# Patient Record
Sex: Female | Born: 1995 | Race: Black or African American | Hispanic: No | Marital: Single | Smoking: Never smoker
Health system: Southern US, Community
[De-identification: ages and names within clinical notes are randomized; demographics above are authoritative.]

## PROBLEM LIST (undated history)

## (undated) DIAGNOSIS — Z789 Other specified health status: Secondary | ICD-10-CM

---
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
## 2017-08-07 ENCOUNTER — Other Ambulatory Visit: Payer: Self-pay

## 2017-08-07 ENCOUNTER — Emergency Department (HOSPITAL_COMMUNITY)
Admission: EM | Admit: 2017-08-07 | Discharge: 2017-08-07 | Disposition: A | Discharge status: Home / Self Care | Payer: BLUE CROSS/BLUE SHIELD | Attending: Emergency Medicine | Admitting: Emergency Medicine

## 2017-08-07 ENCOUNTER — Emergency Department (HOSPITAL_COMMUNITY): Payer: BLUE CROSS/BLUE SHIELD

## 2017-08-07 ENCOUNTER — Encounter (HOSPITAL_COMMUNITY): Payer: Self-pay | Admitting: Emergency Medicine

## 2017-08-07 DIAGNOSIS — Y999 Unspecified external cause status: Secondary | ICD-10-CM | POA: Diagnosis not present

## 2017-08-07 DIAGNOSIS — X58XXXA Exposure to other specified factors, initial encounter: Secondary | ICD-10-CM | POA: Insufficient documentation

## 2017-08-07 DIAGNOSIS — Y929 Unspecified place or not applicable: Secondary | ICD-10-CM | POA: Insufficient documentation

## 2017-08-07 DIAGNOSIS — S39012A Strain of muscle, fascia and tendon of lower back, initial encounter: Secondary | ICD-10-CM | POA: Diagnosis not present

## 2017-08-07 DIAGNOSIS — Z79899 Other long term (current) drug therapy: Secondary | ICD-10-CM | POA: Diagnosis not present

## 2017-08-07 DIAGNOSIS — Y939 Activity, unspecified: Secondary | ICD-10-CM | POA: Diagnosis not present

## 2017-08-07 DIAGNOSIS — R109 Unspecified abdominal pain: Secondary | ICD-10-CM | POA: Diagnosis present

## 2017-08-07 LAB — URINALYSIS, ROUTINE W REFLEX MICROSCOPIC
Bilirubin Urine: NEGATIVE
Glucose, UA: NEGATIVE mg/dL
Ketones, ur: NEGATIVE mg/dL
Nitrite: NEGATIVE
Protein, ur: NEGATIVE mg/dL
SPECIFIC GRAVITY, URINE: 1.014 (ref 1.005–1.030)
pH: 7 (ref 5.0–8.0)

## 2017-08-07 LAB — PREGNANCY, URINE: PREG TEST UR: NEGATIVE

## 2017-08-07 MED ORDER — NAPROXEN 500 MG PO TABS: 500.0000 mg | ORAL_TABLET | Freq: Two times a day (BID) | ORAL | 0 refills | Status: DC

## 2017-08-07 MED ORDER — CYCLOBENZAPRINE HCL 10 MG PO TABS: 10.0000 mg | ORAL_TABLET | Freq: Two times a day (BID) | ORAL | 0 refills | Status: DC | PRN

## 2017-08-07 MED ORDER — KETOROLAC TROMETHAMINE 15 MG/ML IJ SOLN
15.0000 mg | Freq: Once | INTRAMUSCULAR | Status: AC
  Administered 2017-08-07: 15 mg via INTRAMUSCULAR
  Filled 2017-08-07: qty 1

## 2017-08-07 NOTE — Discharge Instructions (Signed)
Please read attached information regarding your condition and heat therapy. Take naproxen and Flexeril scheduled for the next 2-3 days.  Apply heating pad to affected area and stretch as tolerated. We will contact you with the results of your urine culture when it is available. Follow-up with your primary care provider for further evaluation. Return to ED for worsening symptoms, increasing or severe back pain, injury or falls, numbness in legs, trouble walking, loss of bladder function.

## 2017-08-07 NOTE — ED Notes (Addendum)
Pt is alert and orinted x 4 and is verbally responsive. Pt reports that she was treated for a UTI about a week gao. Pt reports bilateral flank pain that started 2 days ago. Pt denies pain and burning with urination, Pt does report having dark or cloudy urine. Pt mother is at bedside,

## 2017-08-07 NOTE — ED Provider Notes (Signed)
[facility]-EMERGENCY DEPT Provider Note   CSN: [id number] Arrival date &[id number] time: 08/07/17  1631     History   Chief Complaint Chief Complaint  Patient presents with  . Flank Pain    HPI Amanda Wilkinson is a 22 y.o. female who presents to ED for evaluation of one-week history for bilateral flank pain with associated dark and foul-smelling urine.  She was recently treated for UTI about 2 weeks ago and completed her course of antibiotics.  She does report improvement in her dysuria and pain with urination stated that now she is having the back pain.  Describes the pain as sharp, worse with movement and better with rest.  Took a few doses of Tylenol with mild improvement in her symptoms.  Also reports one episode of vomiting last week when the pain began.  Denies any vaginal discharge, abdominal pain, history of kidney stones, hematuria, diarrhea or constipation.  HPI  History reviewed. No pertinent past medical history.  There are no active problems to display for this patient.   History reviewed. No pertinent surgical history.  OB History    No data available       Home Medications    Prior to Admission medications   Medication Sig Start Date End Date Taking? Authorizing Provider  acetaminophen (TYLENOL) 500 MG tablet Take 500 mg by mouth every 6 (six) hours as needed for mild pain or moderate pain.   Yes [provider]  norethindrone-ethinyl estradiol (JUNEL FE,GILDESS FE,LOESTRIN FE) 1-20 MG-MCG tablet Take 1 tablet by mouth daily.   Yes [provider]  cyclobenzaprine (FLEXERIL) 10 MG tablet Take 1 tablet (10 mg total) by mouth 2 (two) times daily as needed for muscle spasms. 08/07/17   Amberlyn Martinezgarcia, PA-C  naproxen (NAPROSYN) 500 MG tablet Take 1 tablet (500 mg total) by mouth 2 (two) times daily. 08/07/17   Tessica Cupo, PA-C  sulfamethoxazole-trimethoprim (BACTRIM DS,SEPTRA DS) 800-160 MG tablet Take 1 tablet by mouth 2 (two) times  daily. 07/19/17   [provider]    Family History No family history on file.  Social History Social History   Tobacco Use  . Smoking status: Never Smoker  Substance Use Topics  . Alcohol use: No  . Drug use: Not on file     Allergies   Patient has no known allergies.   Review of Systems Review of Systems  Constitutional: Negative for appetite change, chills and fever.  HENT: Negative for ear pain, rhinorrhea, sneezing and sore throat.   Eyes: Negative for photophobia and visual disturbance.  Respiratory: Negative for cough, chest tightness, shortness of breath and wheezing.   Cardiovascular: Negative for chest pain and palpitations.  Gastrointestinal: Positive for vomiting. Negative for abdominal pain, blood in stool, constipation, diarrhea and nausea.  Genitourinary: Positive for flank pain. Negative for dysuria, hematuria and urgency.       +foul smelling, dark urine  Musculoskeletal: Negative for myalgias.  Skin: Negative for rash.  Neurological: Negative for dizziness, weakness and light-headedness.     Physical Exam Updated Vital Signs BP (!) 152/99 (BP Location: Right Arm)   Pulse (!) 102   Temp 98 F (36.7 C) (Oral)   Resp 16   Ht 5\' 5"  (1.651 m)   Wt 73.5 kg (162 lb)   LMP 08/07/2017   SpO2 100%   BMI 26.96 kg/m   Physical Exam  Constitutional: She appears well-developed and well-nourished. No distress.  Nontoxic appearing and in no acute distress.  Does appear in slightly uncomfortable.  HENT:  Head: Normocephalic and atraumatic.  Nose: Nose normal.  Eyes: Conjunctivae and EOM are normal. Right eye exhibits no discharge. Left eye exhibits no discharge. No scleral icterus.  Neck: Normal range of motion. Neck supple.  Cardiovascular: Normal rate, regular rhythm, normal heart sounds and intact distal pulses. Exam reveals no gallop and no friction rub.  No murmur heard. Pulmonary/Chest: Effort normal and breath sounds normal. No respiratory  distress.  Abdominal: Soft. Bowel sounds are normal. She exhibits no distension. There is no tenderness. There is no guarding.    Bilateral CVA tenderness.  Musculoskeletal: Normal range of motion. She exhibits no edema.       Arms: No midline spinal tenderness present in lumbar, thoracic or cervical spine. No step-off palpated. No visible bruising, edema or temperature change noted. No objective signs of numbness present. No saddle anesthesia. 2+ DP pulses bilaterally. Sensation intact to light touch. Strength 5/5 in bilateral lower extremities.  Neurological: She is alert. She exhibits normal muscle tone. Coordination normal.  Skin: Skin is warm and dry. No rash noted.  Psychiatric: She has a normal mood and affect.  Nursing note and vitals reviewed.    ED Treatments / Results  Labs (all labs ordered are listed, but only abnormal results are displayed) Labs Reviewed  URINALYSIS, ROUTINE W REFLEX MICROSCOPIC - Abnormal; Notable for the following components:      Result Value   APPearance HAZY (*)    Hgb urine dipstick LARGE (*)    Leukocytes, UA LARGE (*)    Bacteria, UA MANY (*)    Squamous Epithelial / LPF 6-30 (*)    All other components within normal limits  URINE CULTURE  PREGNANCY, URINE    EKG  EKG Interpretation None       Radiology Ct Renal Stone Study  Result Date: 08/07/2017 CLINICAL DATA:  22 year old who was treated for urinary tract infection approximately 1 week ago, presenting with acute onset of bilateral flank pain 2 days ago. EXAM: CT ABDOMEN AND PELVIS WITHOUT CONTRAST TECHNIQUE: Multidetector CT imaging of the abdomen and pelvis was performed following the standard protocol without IV contrast. COMPARISON:  None. FINDINGS: Lower chest: Heart size normal.  Visualized lung bases clear. Hepatobiliary: Normal unenhanced appearance of the liver. Gallbladder contracted and unremarkable (food is present within the stomach). No calcified gallstones. No biliary  ductal dilation. Pancreas: Normal unenhanced appearance. Spleen: Normal unenhanced appearance. Adrenals/Urinary Tract: Normal appearing adrenal glands. No evidence of urinary tract calculi or obstruction on either side. Within the limits of the unenhanced technique, no focal parenchymal abnormality involving either kidney. Urinary bladder decompressed. Stomach/Bowel: Food within the normal appearing stomach. Normal-appearing small bowel. Expected colonic stool burden. Transverse colon, descending colon, sigmoid colon and rectum decompressed. No focal colonic abnormality identified. Normal appearing gas-filled appendix in the right mid and lower pelvis. Vascular/Lymphatic: No visible atherosclerosis. No pathologic lymphadenopathy. Reproductive: Retroverted uterus with a normal unenhanced appearance. Normal unenhanced appearance of the ovaries. No adnexal masses. Other: None. Musculoskeletal: Regional skeleton intact without acute or significant osseous abnormality. IMPRESSION: Normal unenhanced CT of the abdomen and pelvis. Specifically, no evidence of urinary tract calculi. Electronically Signed   By: Hulan Saas M.D.   On: 08/07/2017 20:25    Procedures Procedures (including critical care time)  Medications Ordered in ED Medications  ketorolac (TORADOL) 15 MG/ML injection 15 mg (not administered)     Initial Impression / Assessment and Plan / ED Course  I have reviewed  the triage vital signs and the nursing notes.  Pertinent labs & imaging results that were available during my care of the patient were reviewed by me and considered in my medical decision making (see chart for details).     Patient, with no significant past medical history, who presents to ED for evaluation of one-week history of bilateral flank pain with associated dark and foul-smelling urine.  She was recently treated for UTI about 2 weeks ago and completed her course of antibiotics.  She reports improvement in her dysuria  and urinary discomfort but now she is having the back pain.  Cannot recall any inciting event.  Denies any vaginal discharge, abdominal pain, history of kidney stones, hematuria, diarrhea or constipation.  On physical exam well-appearing but does have some tenderness to palpation of bilateral flanks.  She has no deficits on her neurological exam that would concern me for a spinal cord injury or other infectious cause of her back pain.  She is afebrile with no history of fever.  She denies any vaginal symptoms and declines a pelvic exam at this time.  She has no abdominal tenderness to palpation.  Urinalysis with WBCs, leukocytosis but was also contaminated sample.  Due to her hematuria, flank pain CT was obtained to rule out nephrolithiasis.  This returned as negative for acute abnormality.  I suspect that this could either be an ongoing UTI or just a muscle strain in the area.  I reassured her of her negative workup today but will send her urine for culture in case she needs to be on additional antibiotic therapy.  I did give her supportive measures with anti-inflammatories, muscle relaxer and heat therapy and advised her to follow-up with orthopedist for further evaluation if symptoms persist.  Patient appears stable for discharge at this time.  Strict return precautions given.  Final Clinical Impressions(s) / ED Diagnoses   Final diagnoses:  Strain of lumbar region, initial encounter    ED Discharge Orders        Ordered    naproxen (NAPROSYN) 500 MG tablet  2 times daily     08/07/17 2035    cyclobenzaprine (FLEXERIL) 10 MG tablet  2 times daily PRN     08/07/17 2035     Portions of this note were generated with Dragon dictation software. Dictation errors may occur despite best attempts at proofreading.    Dietrich Pates, PA-C 08/07/17 2042    Terrilee Files, MD 08/08/17 Rickey Primus

## 2017-08-07 NOTE — ED Triage Notes (Signed)
Patient was seen at clinic in December for UTI and was on antibiotics. Patient started having back pain last week with foul odor in urine. Patient reports that she did start her menstrual cycle today.

## 2017-08-07 NOTE — ED Provider Notes (Signed)
Patient placed in Quick Look pathway, seen and evaluated   Chief Complaint: R flank pain and lower back pain since last week  HPI:   Reports R sided flank pain described as pressure, now radiating across back; started after beginning abx for UTI; no dysuria but does have foul odor to urine; no vaginal discharge or abnormal bleeding, abdominal pain  ROS: R flank pain  Physical Exam:   Gen: No distress  Neuro: Awake and Alert  Skin: Warm  Focused Exam: no TTP of R flank, no R CVA tenderness, no midline spinal tenderness   Initiation of care has begun. The patient has been counseled on the process, plan, and necessity for staying for the completion/evaluation, and the remainder of the medical screening examination   Dietrich PatesKhatri, Pegge Cumberledge, PA-C 08/07/17 1649    Terrilee FilesButler, Michael C, MD 08/08/17 [phone]

## 2017-08-09 LAB — URINE CULTURE

## 2019-06-26 ENCOUNTER — Other Ambulatory Visit: Payer: Self-pay

## 2019-06-26 DIAGNOSIS — Z20822 Contact with and (suspected) exposure to covid-19: Secondary | ICD-10-CM

## 2019-06-30 LAB — NOVEL CORONAVIRUS, NAA: SARS-CoV-2, NAA: DETECTED — AB

## 2019-07-01 ENCOUNTER — Telehealth: Payer: Self-pay

## 2019-07-01 NOTE — Telephone Encounter (Signed)
Pt notified of positive COVID-19 test results. Pt verbalized understanding. Pt reports that she had loss of taste and smell but they have returned.Pt advised to remain in self quarantine until at least 10 days since symptom onset And 3 consecutive days fever free without antipyretics And improvement in respiratory symptoms. Patient advised to utilize over the counter medications to treat symptoms. Pt advised to seek treatment in the ED if respiratory issues/distress develops.Pt advised they should only leave home to seek and medical care and must wear a mask in public. Pt instructed to limit contact with family members or caregivers in the home. Pt advised to practice social distancing and to continue to use good preventative care measures such has frequent hand washing, staying out of crowds and cleaning hard surfaces frequently touched in the home.Pt informed that the health department will likely follow up and may have additional recommendations.

## 2019-08-06 ENCOUNTER — Ambulatory Visit: Payer: Medicaid Other | Attending: Internal Medicine

## 2019-08-06 DIAGNOSIS — Z20822 Contact with and (suspected) exposure to covid-19: Secondary | ICD-10-CM

## 2019-08-07 LAB — NOVEL CORONAVIRUS, NAA: SARS-CoV-2, NAA: NOT DETECTED

## 2019-08-10 IMAGING — CT CT RENAL STONE PROTOCOL
2 of 4 series · 16 of 46 positions shown, 18 images · non-contrast
Comparison: None.

CLINICAL DATA: 21-year-old who was treated for urinary tract
infection approximately 1 week ago, presenting with acute onset of
bilateral flank pain 2 days ago.

EXAM:
CT ABDOMEN AND PELVIS WITHOUT CONTRAST
TECHNIQUE: Multidetector CT imaging of the abdomen and pelvis was performed
following the standard protocol without IV contrast.

[Series 2: axial st · axial · 0.63mm/px · z∈[-318,+67]mm · 13 of 89 slices shown, 15 images]
[im 6/89  soft-tissue]
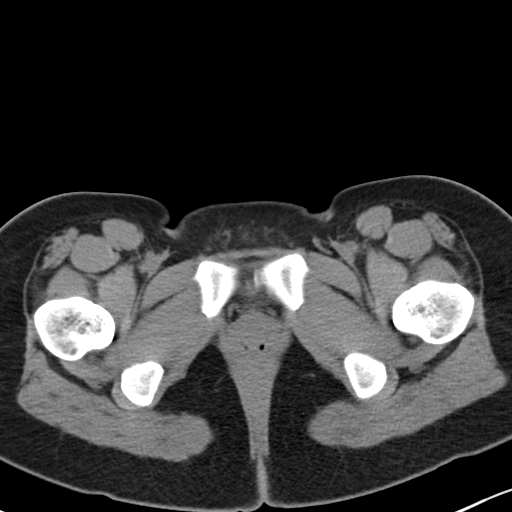
[im 6/89  bone]
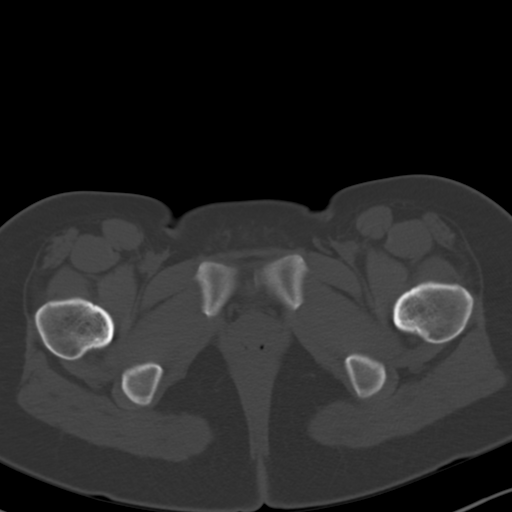
[im 11/89  soft-tissue]
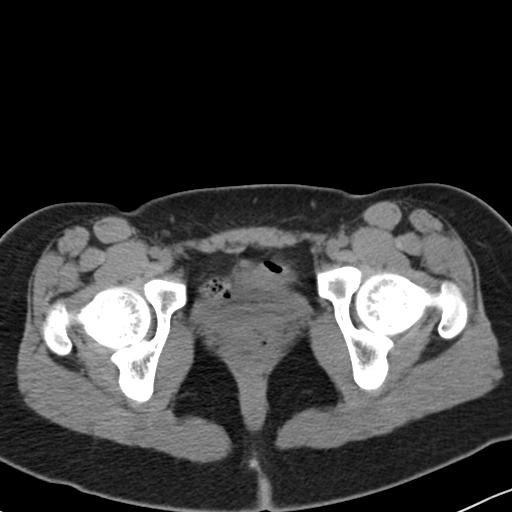
[im 21/89  soft-tissue]
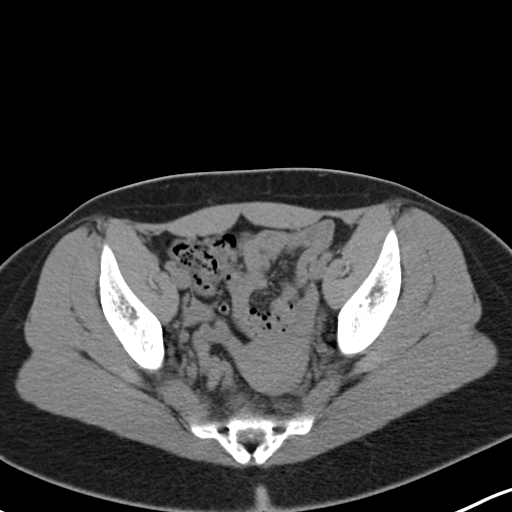
[im 26/89  soft-tissue]
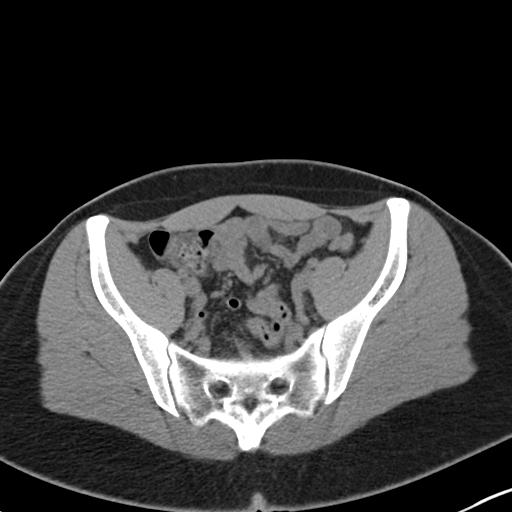
[im 32/89  soft-tissue]
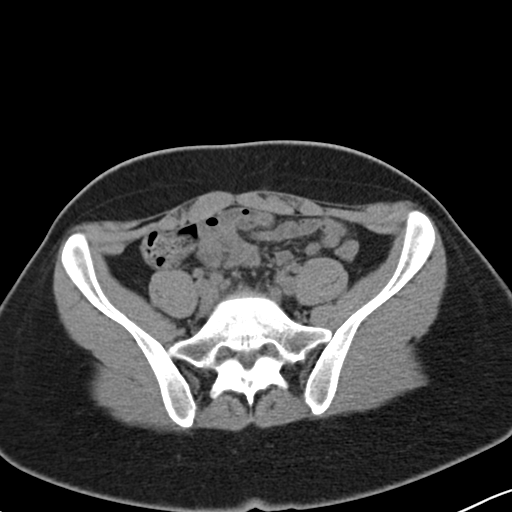
[im 37/89  soft-tissue]
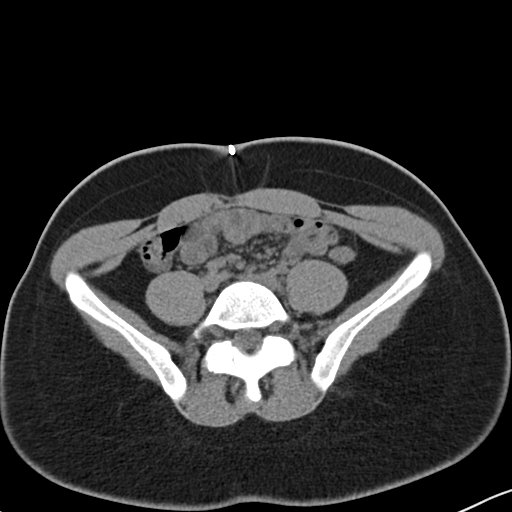
[im 47/89  soft-tissue]
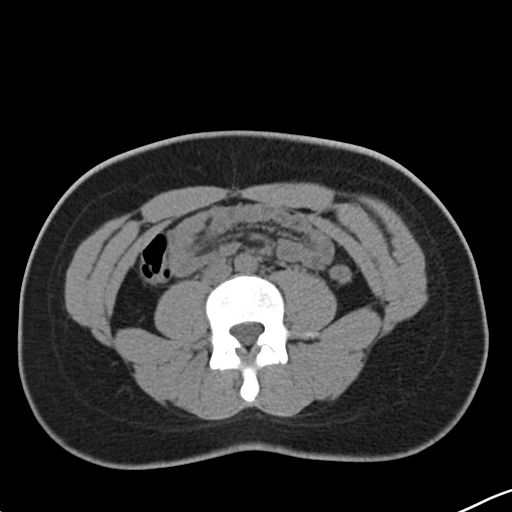
[im 52/89  soft-tissue]
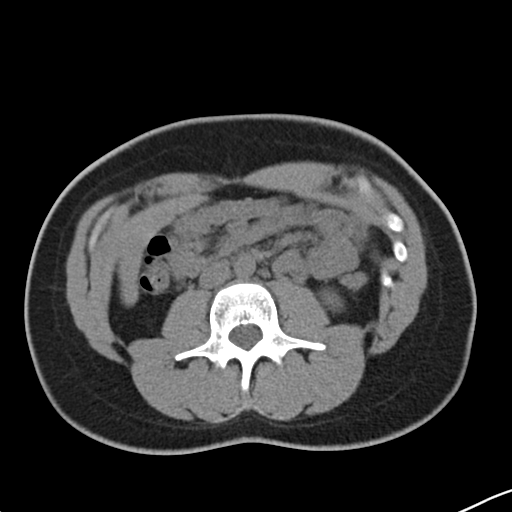
[im 57/89  soft-tissue]
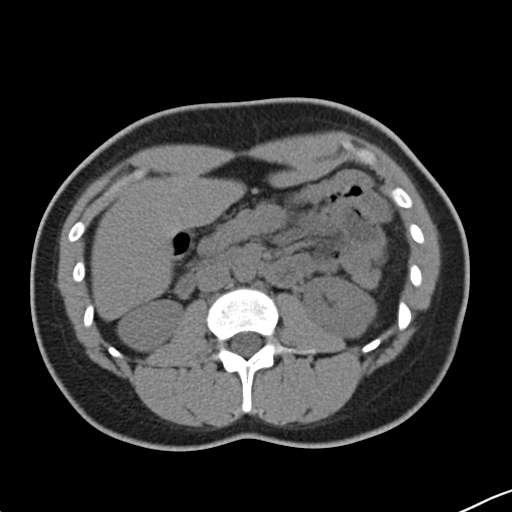
[im 57/89  bone]
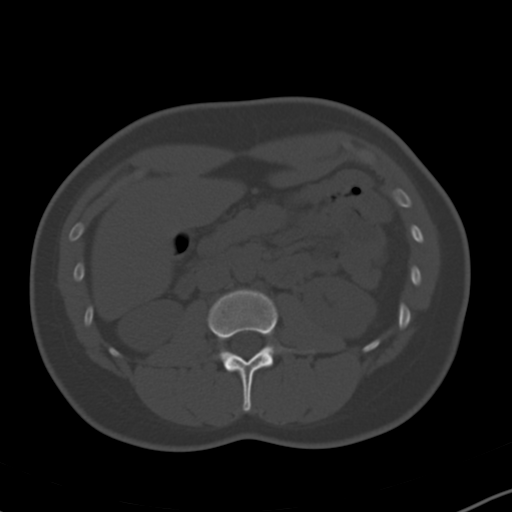
[im 63/89  soft-tissue]
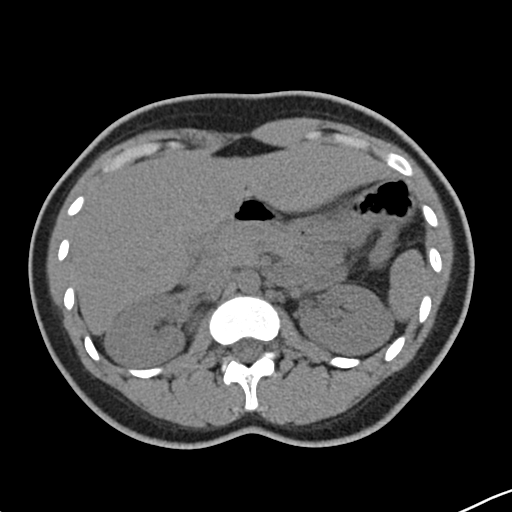
[im 68/89  soft-tissue]
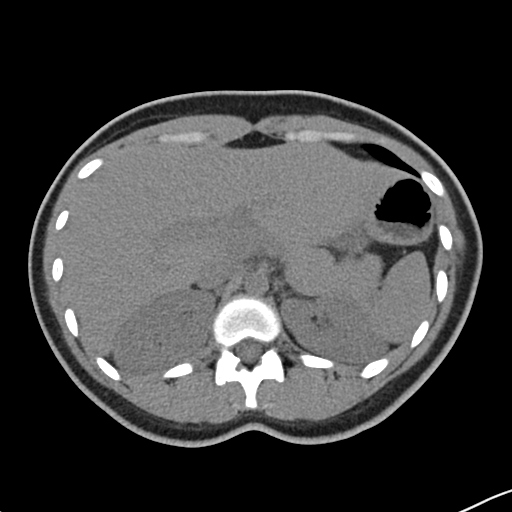
[im 78/89  soft-tissue]
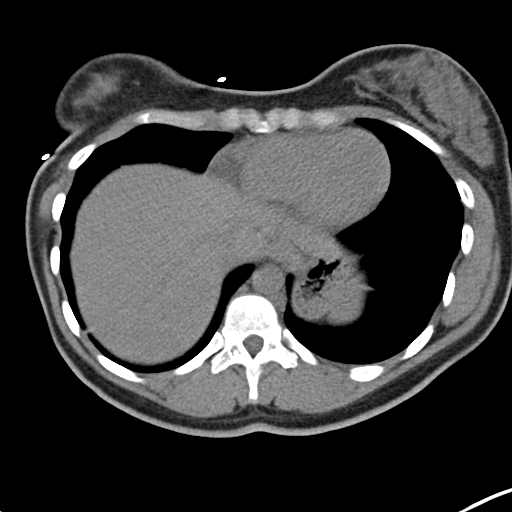
[im 83/89  soft-tissue]
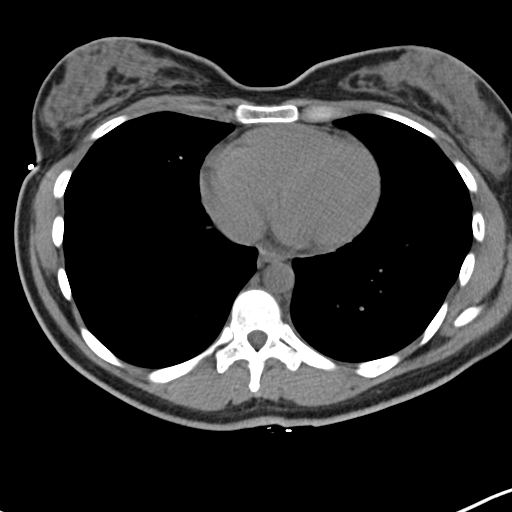

[Series 5: coronal · coronal · 0.74mm/px · 3 of 150 slices shown]
[im 50/150  soft-tissue]
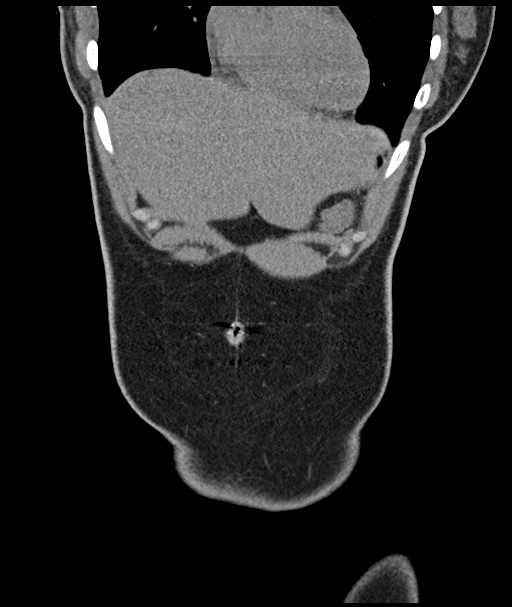
[im 67/150  soft-tissue]
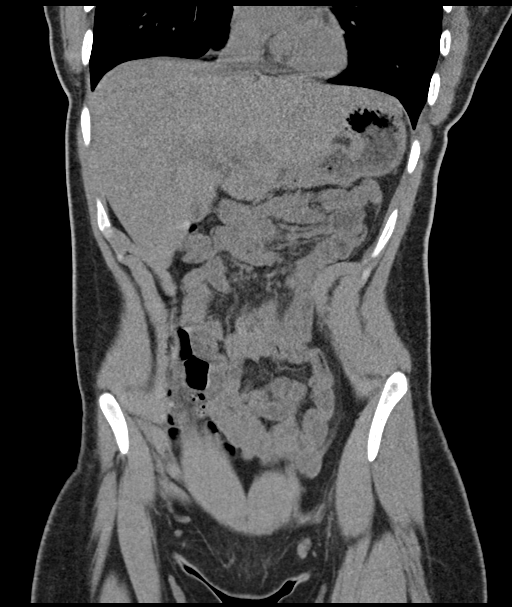
[im 83/150  soft-tissue]
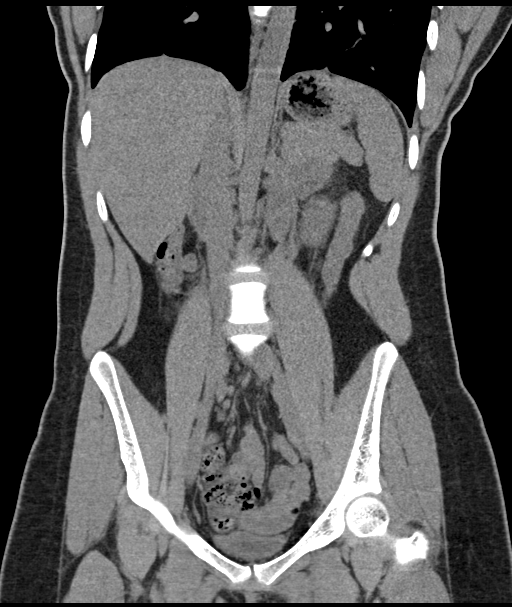

[16 of 46 positions shown; findings below may reference images not displayed]

FINDINGS: Lower chest: Heart size normal.  Visualized lung bases clear.

Hepatobiliary: Normal unenhanced appearance of the liver.
Gallbladder contracted and unremarkable (food is present within the
stomach). No calcified gallstones. No biliary ductal dilation.

Pancreas: Normal unenhanced appearance.

Spleen: Normal unenhanced appearance.

Adrenals/Urinary Tract: Normal appearing adrenal glands. No evidence
of urinary tract calculi or obstruction on either side. Within the
limits of the unenhanced technique, no focal parenchymal abnormality
involving either kidney. Urinary bladder decompressed.

Stomach/Bowel: Food within the normal appearing stomach.
Normal-appearing small bowel. Expected colonic stool burden.
Transverse colon, descending colon, sigmoid colon and rectum
decompressed. No focal colonic abnormality identified. Normal
appearing gas-filled appendix in the right mid and lower pelvis.

Vascular/Lymphatic: No visible atherosclerosis. No pathologic
lymphadenopathy.

Reproductive: Retroverted uterus with a normal unenhanced
appearance. Normal unenhanced appearance of the ovaries. No adnexal
masses.

Other: None.

Musculoskeletal: Regional skeleton intact without acute or
significant osseous abnormality.
IMPRESSION: Normal unenhanced CT of the abdomen and pelvis. Specifically, no
evidence of urinary tract calculi.

## 2019-11-13 ENCOUNTER — Ambulatory Visit: Payer: Medicaid Other | Attending: Internal Medicine

## 2019-11-13 DIAGNOSIS — Z20822 Contact with and (suspected) exposure to covid-19: Secondary | ICD-10-CM

## 2019-11-15 LAB — SARS-COV-2, NAA 2 DAY TAT

## 2019-11-15 LAB — NOVEL CORONAVIRUS, NAA: SARS-CoV-2, NAA: NOT DETECTED

## 2019-11-17 ENCOUNTER — Encounter (HOSPITAL_COMMUNITY): Payer: Self-pay

## 2019-11-17 ENCOUNTER — Other Ambulatory Visit: Payer: Self-pay

## 2019-11-17 ENCOUNTER — Emergency Department (HOSPITAL_COMMUNITY)
Admission: EM | Admit: 2019-11-17 | Discharge: 2019-11-17 | Disposition: A | Discharge status: Home / Self Care | Payer: 59 | Attending: Emergency Medicine | Admitting: Emergency Medicine

## 2019-11-17 DIAGNOSIS — S0990XA Unspecified injury of head, initial encounter: Secondary | ICD-10-CM | POA: Diagnosis not present

## 2019-11-17 DIAGNOSIS — Y999 Unspecified external cause status: Secondary | ICD-10-CM | POA: Diagnosis not present

## 2019-11-17 DIAGNOSIS — Y929 Unspecified place or not applicable: Secondary | ICD-10-CM | POA: Diagnosis not present

## 2019-11-17 DIAGNOSIS — S80212A Abrasion, left knee, initial encounter: Secondary | ICD-10-CM | POA: Diagnosis not present

## 2019-11-17 DIAGNOSIS — Y9302 Activity, running: Secondary | ICD-10-CM | POA: Diagnosis not present

## 2019-11-17 DIAGNOSIS — W01198A Fall on same level from slipping, tripping and stumbling with subsequent striking against other object, initial encounter: Secondary | ICD-10-CM | POA: Insufficient documentation
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
## 2019-11-17 NOTE — Discharge Instructions (Addendum)
Clean the wound of your left knee with soap and water daily and apply ointment such as Neosporin to help it heal.  For the head injury, use Tylenol or Motrin for pain.  Return here, if needed, for problems.
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
## 2020-07-25 NOTE — L&D Delivery Note (Signed)
Delivery Note At 2:15 AM a viable female was delivered via Vaginal, Spontaneous.  Presentation: vertex; Position: Left,, Occiput,, Anterior;  Delivery of the head with tight nuchal reduced.  Anterior shoulder did not deliver with gentle downward traction.  Shoulder dystocia was identified and start time was observed.  Posterior arm was palpated and already flexed with hand at introitus.  Posterior arm was delivered which allowed delivery with gentle downward traction of the anterior shoulder and body. Total time from head delivery to body delivery was 1 minute and 20 seconds per RN recording.  Baby to abdomen with poor tone but good HR.  Code APGAR was called when no response to drying/stimulation.  Cord was clamped and cut and newborn was handed off to NICU team.  Please see NICU note for further neonatal management.    APGAR: per NICU; weight pending.   Placenta status: S, I.   Cord: 3V with the following complications: none.    Anesthesia:  CLEA Episiotomy: None Lacerations: 2nd degree;Periurethral Suture Repair: 3.0 vicryl rapide Est. Blood Loss (mL): 350  Mom to postpartum.  Baby to Couplet care / Skin to Skin.  Mitchel Honour 07/14/2021, 2:43 AM

## 2020-12-01 LAB — OB RESULTS CONSOLE HEPATITIS B SURFACE ANTIGEN: Hepatitis B Surface Ag: NEGATIVE

## 2020-12-01 LAB — OB RESULTS CONSOLE HIV ANTIBODY (ROUTINE TESTING): HIV: NONREACTIVE

## 2020-12-01 LAB — HEPATITIS C ANTIBODY: HCV Ab: NEGATIVE

## 2020-12-01 LAB — OB RESULTS CONSOLE ABO/RH: RH Type: POSITIVE

## 2020-12-01 LAB — OB RESULTS CONSOLE RPR: RPR: NONREACTIVE

## 2020-12-01 LAB — OB RESULTS CONSOLE RUBELLA ANTIBODY, IGM: Rubella: IMMUNE

## 2020-12-01 LAB — OB RESULTS CONSOLE ANTIBODY SCREEN: Antibody Screen: NEGATIVE

## 2020-12-23 ENCOUNTER — Telehealth: Payer: Self-pay

## 2020-12-28 ENCOUNTER — Other Ambulatory Visit: Payer: Self-pay

## 2021-01-12 ENCOUNTER — Ambulatory Visit: Payer: No Typology Code available for payment source

## 2021-01-19 ENCOUNTER — Ambulatory Visit
Payer: No Typology Code available for payment source | Attending: Obstetrics and Gynecology | Admitting: Genetic Counselor

## 2021-01-19 ENCOUNTER — Other Ambulatory Visit: Payer: Self-pay

## 2021-01-19 ENCOUNTER — Encounter: Payer: Self-pay | Admitting: Genetic Counselor

## 2021-01-19 DIAGNOSIS — Z315 Encounter for genetic counseling: Secondary | ICD-10-CM | POA: Diagnosis not present

## 2021-01-19 NOTE — Progress Notes (Signed)
01/19/2021  SHANTEE HAYNE 05/15/1996 MRN: [id number] DOV: 01/19/2021  Ms. 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 Ms. Hinostroza was accompanied to her appointment by her partner, Leonette Most "Luther Parody" Smoaks.   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 Bann is a G76P0, 25 y.o. female. Her current pregnancy has completed [redacted]w[redacted]d (Estimated Date of Delivery: 07/07/21).  Ms. Javid denied exposure to environmental toxins or chemical agents. She denied the use of alcohol, tobacco or street drugs. She reported taking prenatal vitamins and Diclegis. She denied significant viral illnesses, fevers, and bleeding during the course of her pregnancy. Her medical and surgical histories were noncontributory.  Family History  A three generation pedigree was drafted and reviewed. Both family histories were reviewed and found to be noncontributory for birth defects, intellectual disability, recurrent pregnancy loss, and known genetic conditions.    The patient's ancestry is African American. The father of the pregnancy's ancestry is African American. Ashkenazi Jewish ancestry and consanguinity were denied. Pedigree will be scanned under Media.  Discussion  Spinal muscular atrophy:  Ms. 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 Ms. Kerman was found to have 2 copies of the SMN1 gene on Horizon-2 carrier screening; however, she also has the c.*3+80T>G polymorphism of SMN1 in intron 7 (also known as [id number]) found more commonly in silent carriers. This puts her at increased risk (1 in 34, or ~3%) to be a silent carrier for SMA. This  means that there is a 97% chance that she is not a carrier for SMA.  We discussed that SMA is a condition caused by mutations in the SMN1 gene. Typically, individuals have two copies of the SMN1 gene, with one copy present on each chromosome. In SMA silent carriers, both copies of the SMN1 gene are present on one chromosome, with no copies of SMN1 present on the other chromosome (2+0).   SMA is characterized by progressive muscle weakness and atrophy due to degeneration and loss of anterior horn cells (lower motor neurons) in the spinal cord and brain stem. We discussed the different types of SMA, including differences in severity and age of onset. We also reviewed the autosomal recessive inheritance pattern of SMA. We discussed that the couple only has a chance of having a child with SMA if both parents are identified as carriers for the condition. Based on the carrier frequency for SMA in the African American population, Ms. Busker's partner currently has a 1 in 16 chance of being a carrier of SMA. Without partner screening to refine risk and based on her partner's ethnicity, the couple currently has a 1 in 8024 (0.01%) risk of having a child with SMA. If Ms. Leffel's partner were found to have 2 copies of SMN1, his risk of being a carrier would be reduced but not eliminated. However, if both Ms. Goldsmith and her partner are carriers of SMA, they would have a 1 in 4 (25%) chance of having an affected fetus with each pregnancy.   Other carrier screening results:  Ms. Christensen's Horizon-2 carrier screening was negative for the other condition screened (cystic fibrosis). Thus, her risk to be a carrier for this condition has been reduced but  not eliminated. This also significantly reduces her risk of having a child affected by this condition.   We also reviewed that Ms. Weinhold had a hemoglobin electrophoresis to assess her carrier status for hemoglobinopathies such as sickle cell disease. We discussed that  hemoglobin electrophoresis did not reveal that Ms. 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 This does not increase her chance of carrying a hemoglobinopathy or having a child with a hemoglobinopathy.   We discussed that carrier testing for SMA is recommended for Ms. Ficken's partner. Ms. Spickard and her partner indicated that they are interested in pursuing partner carrier screening. I also informed Ms. Silberman that SMA is now included on [place]'s newborn screen.   Aneuploidy screening results:  We also reviewed that Ms. Edinger had Panorama noninvasive prenatal screening (NIPS) through the laboratory [facility] that was low-risk for fetal aneuploidies. We reviewed that these results showed a less than 1 in 10,000 risk for trisomies 21, 18 and 13, and monosomy X (Turner syndrome). In addition, the risk for triploidy and sex chromosome trisomies (47,XXX and 47,XXY) was also low. Ms. Firebaugh elected to have cfDNA analysis for 22q11.2 deletion syndrome, which was also low risk (1 in 3100). We reviewed that while this testing identifies 94-99% of pregnancies with trisomy 72, trisomy 24, and trisomy 7, and >70% of cases of sex chromosome aneuploidies, it is NOT diagnostic. A positive test result requires confirmation by CVS or amniocentesis, and a negative test result does not rule out a fetal chromosome abnormality. She also understands that this testing does not identify all genetic conditions.  Diagnostic testing:  Ms. Folts was also counseled regarding diagnostic testing via amniocentesis from 16 weeks' gestation. We discussed the technical aspects of the procedure and quoted up to a 1 in 500 (0.2%) risk for spontaneous pregnancy loss or other adverse pregnancy outcomes as a result of amniocentesis. Cultured cells from an amniocentesis sample allow for the visualization of a fetal karyotype, which can detect  >99% of large chromosomal aberrations. Chromosomal microarray can also be performed to identify smaller deletions or duplications of fetal chromosomal material. Amniocentesis could also be performed to assess whether the baby is affected by SMA.   Ms. Hafen was not interested in pursuing amniocentesis. She understands that amniocentesis is available at any point after 16 weeks of pregnancy and that she may opt to undergo the procedure at a later date should she change her mind.  Plan:  The couple informed me that they felt reassured by their ~0.01% chance of having a child with SMA. However, Ms. Lunney's partner expressed possible interest in pursuing partner carrier screening for SMA. Mr. Janee Morn requested that I perform a benefits investigation to estimate his out of pocket cost for testing prior to placing an order. I informed him that if the expected cost is >$250 if testing is pursued through his insurance, the laboratory [facility] offers a self-pay option of $250. I will call with results from the benefits investigation and facilitate partner carrier screening if still desired from there.  I counseled Ms. Emmons regarding the above risks and available options. The approximate face-to-face time with the genetic counselor was 30 minutes.  In summary: Discussed carrier screening results and options for follow-up testing Increased risk (1 in 34, or ~3%) chance to be silent carrier for spinal muscular atrophy. 97% chance patient is not a carrier for condition Partner possibly interested in  carrier screening for SMA. I will perform benefits investigation to estimate testing costs and facilitate testing from there if desired Reviewed low-risk NIPS result Reduction in risk for Down syndrome, trisomy 58, trisomy 64, triploidy, sex chromosome aneuploidies, and 22q11.2 deletion syndrome Offered additional testing and screening Not interested in amniocentesis Reviewed family history concerns   Gershon Crane, MS, Aeronautical engineer

## 2021-01-20 ENCOUNTER — Telehealth: Payer: Self-pay | Admitting: Genetic Counselor

## 2021-01-20 NOTE — Telephone Encounter (Signed)
LVM for Amanda Wilkinson informing her that the laboratory [facility] attempted to run a benefits investigation through her partner [name]'s insurance to determine the cost for partner carrier screening; however, his insurance plan appears to be inactive. I encouraged her to have her partner contact her insurance company to check in and contact us once they have more information and have decided how they would like to proceed.  Gershon Crane, MS, [facility] Genetic Counselor

## 2021-02-03 ENCOUNTER — Ambulatory Visit: Payer: Self-pay

## 2021-06-14 LAB — OB RESULTS CONSOLE GBS: GBS: NEGATIVE

## 2021-06-15 LAB — OB RESULTS CONSOLE GC/CHLAMYDIA
Chlamydia: NEGATIVE
Gonorrhea: NEGATIVE

## 2021-07-01 ENCOUNTER — Inpatient Hospital Stay (HOSPITAL_COMMUNITY)
Admission: AD | Admit: 2021-07-01 | Discharge: 2021-07-01 | Disposition: A | Discharge status: Home / Self Care | Payer: No Typology Code available for payment source | Attending: Obstetrics and Gynecology | Admitting: Obstetrics and Gynecology

## 2021-07-01 ENCOUNTER — Other Ambulatory Visit: Payer: Self-pay

## 2021-07-01 ENCOUNTER — Encounter (HOSPITAL_COMMUNITY): Payer: Self-pay | Admitting: Obstetrics and Gynecology

## 2021-07-01 DIAGNOSIS — O471 False labor at or after 37 completed weeks of gestation: Secondary | ICD-10-CM | POA: Insufficient documentation

## 2021-07-01 DIAGNOSIS — R030 Elevated blood-pressure reading, without diagnosis of hypertension: Secondary | ICD-10-CM | POA: Diagnosis present

## 2021-07-01 DIAGNOSIS — O26893 Other specified pregnancy related conditions, third trimester: Secondary | ICD-10-CM | POA: Diagnosis present

## 2021-07-01 DIAGNOSIS — Z3A39 39 weeks gestation of pregnancy: Secondary | ICD-10-CM | POA: Insufficient documentation

## 2021-07-01 LAB — COMPREHENSIVE METABOLIC PANEL
ALT: 17 U/L (ref 0–44)
AST: 21 U/L (ref 15–41)
Albumin: 3 g/dL — ABNORMAL LOW (ref 3.5–5.0)
Alkaline Phosphatase: 148 U/L — ABNORMAL HIGH (ref 38–126)
Anion gap: 5 (ref 5–15)
BUN: <5 mg/dL — ABNORMAL LOW (ref 6–20)
CO2: 24 mmol/L (ref 22–32)
Calcium: 9.5 mg/dL (ref 8.9–10.3)
Chloride: 106 mmol/L (ref 98–111)
Creatinine, Ser: 0.61 mg/dL (ref 0.44–1.00)
GFR, Estimated: >60 mL/min (ref >60)
Glucose, Bld: 80 mg/dL (ref 70–99)
Potassium: 3.9 mmol/L (ref 3.5–5.1)
Sodium: 135 mmol/L (ref 135–145)
Total Bilirubin: 0.4 mg/dL (ref 0.3–1.2)
Total Protein: 6.5 g/dL (ref 6.5–8.1)

## 2021-07-01 LAB — CBC
HCT: 36 % (ref 36.0–46.0)
Hemoglobin: 11.4 g/dL — ABNORMAL LOW (ref 12.0–15.0)
MCH: 27.2 pg (ref 26.0–34.0)
MCHC: 31.7 g/dL (ref 30.0–36.0)
MCV: 85.9 fL (ref 80.0–100.0)
Platelets: 211 10*3/uL (ref 150–400)
RBC: 4.19 MIL/uL (ref 3.87–5.11)
RDW: 15.3 % (ref 11.5–15.5)
WBC: 10.7 10*3/uL — ABNORMAL HIGH (ref 4.0–10.5)
nRBC: 0 % (ref 0.0–0.2)

## 2021-07-01 LAB — PROTEIN / CREATININE RATIO, URINE
Creatinine, Urine: 114.12 mg/dL
Protein Creatinine Ratio: 0.1 mg/mg{Cre} (ref 0.00–0.15)
Total Protein, Urine: 11 mg/dL

## 2021-07-01 LAB — AMNISURE RUPTURE OF MEMBRANE (ROM) NOT AT ARMC: Amnisure ROM: NEGATIVE

## 2021-07-01 LAB — POCT FERN TEST: POCT Fern Test: NEGATIVE

## 2021-07-01 NOTE — MAU Note (Signed)
Amanda Wilkinson is a 25 y.o. at [redacted]w[redacted]d here in MAU reporting: LOF trickling down her legs since about 1545. Called the office and was advised to come in. Fluid is clear and white. No bleeding. No pain. +FM. Last IC was today before the leaking.   Onset of complaint: today  Pain score: 0/10  Vitals:   07/01/21 1711  BP: 139/81  Pulse: 80  Resp: 16  Temp: 99.6 F (37.6 C)  SpO2: 100%     FHT:156  Lab orders placed from triage: none

## 2021-07-01 NOTE — MAU Provider Note (Signed)
Patient Amanda Wilkinson is a 25 y.o. G1P0 At [redacted]w[redacted]d here with complaints LOF around 3:45opm. She denies vaginal bleeding, decreased fetal movements. She endorses some cramping/contractions but does not think she is in labor.  She denies HA, blurry vision, floating spots, RUQ pain.  She denies any history of elevated blood pressure, gestational diabetes or other complications in this pregnancy.  History     CSN: [id number]  Arrival date and time: 07/01/21 1650   Event Date/Time   First Provider Initiated Contact with Patient 07/01/21 1824      Chief Complaint  Patient presents with   Rupture of Membranes   Vaginal Discharge The patient's primary symptoms include vaginal discharge. The patient's pertinent negatives include no vaginal bleeding. The current episode started today. The problem occurs intermittently. Associated symptoms include abdominal pain. Pertinent negatives include no constipation, diarrhea, dysuria or fever. Associated symptoms comments: Abdominal cramps and braxton hicks.   OB History     Gravida  1   Para      Term      Preterm      AB      Living         SAB      IAB      Ectopic      Multiple      Live Births              History reviewed. No pertinent past medical history.  History reviewed. No pertinent surgical history.  History reviewed. No pertinent family history.  Social History   Tobacco Use   Smoking status: Never  Substance Use Topics   Alcohol use: No   Drug use: Never    Allergies: No Known Allergies  Medications Prior to Admission  Medication Sig Dispense Refill Last Dose   ferrous sulfate 325 (65 FE) MG tablet Take 325 mg by mouth daily with breakfast.      Prenatal Vit-Fe Fumarate-FA (PRENATAL MULTIVITAMIN) TABS tablet Take 1 tablet by mouth daily at 12 noon.      acetaminophen (TYLENOL) 500 MG tablet Take 500 mg by mouth every 6 (six) hours as needed for mild pain or moderate pain.   More than a month    cyclobenzaprine (FLEXERIL) 10 MG tablet Take 1 tablet (10 mg total) by mouth 2 (two) times daily as needed for muscle spasms. 20 tablet 0 More than a month   naproxen (NAPROSYN) 500 MG tablet Take 1 tablet (500 mg total) by mouth 2 (two) times daily. 30 tablet 0 More than a month   norethindrone-ethinyl estradiol (JUNEL FE,GILDESS FE,LOESTRIN FE) 1-20 MG-MCG tablet Take 1 tablet by mouth daily.   More than a month   sulfamethoxazole-trimethoprim (BACTRIM DS,SEPTRA DS) 800-160 MG tablet Take 1 tablet by mouth 2 (two) times daily.  0 More than a month    Review of Systems  Constitutional:  Negative for fever.  Gastrointestinal:  Positive for abdominal pain. Negative for constipation and diarrhea.  Genitourinary:  Positive for vaginal discharge. Negative for dysuria.  Physical Exam   Blood pressure 132/82, pulse 78, temperature 99.6 F (37.6 C), temperature source Oral, resp. rate 16, height 5' 5.5" (1.664 m), weight 101.8 kg, SpO2 99 %.  Physical Exam HENT:     Head: Normocephalic.  Cardiovascular:     Pulses: Normal pulses.  Pulmonary:     Effort: Pulmonary effort is normal.  Skin:    General: Skin is warm.  Neurological:     General: No focal deficit  present.     Mental Status: She is alert.    MAU Course  Procedures  MDM -NST : 145 bpm, mod var, present acel, no decels, uterine irratability -CBC: normal -CMP: normal PCR: 0.1 Amnisure negative  Patient Vitals for the past 24 hrs:  BP Temp Temp src Pulse Resp SpO2 Height Weight  07/01/21 2044 139/81 -- -- 87 -- -- -- --  07/01/21 2015 126/69 -- -- 85 -- 99 % -- --  07/01/21 2000 132/82 -- -- 78 -- 99 % -- --  07/01/21 1945 132/84 -- -- 86 -- 99 % -- --  07/01/21 1930 134/81 -- -- 84 -- 100 % -- --  07/01/21 1915 134/80 -- -- 82 -- 100 % -- --  07/01/21 1900 135/78 -- -- 86 -- 99 % -- --  07/01/21 1845 (!) 141/91 -- -- 84 -- 99 % -- --  07/01/21 1838 140/77 -- -- 87 -- -- -- --  07/01/21 1810 134/77 -- -- 86 -- 100 % --  --  07/01/21 1804 (!) 141/81 -- -- 83 -- -- -- --  07/01/21 1711 139/81 99.6 F (37.6 C) Oral 80 16 100 % -- --  07/01/21 1708 -- -- -- -- -- -- 5' 5.5" (1.664 m) 101.8 kg    Discussed with Dr. Crissie Reese, who agrees with plan for discharge and BP check tomorrow. TC to Dr. Vincente Poli, who agrees.  Assessment and Plan   1. Transient hypertension   -patient will call tomorrow at 8:30 for BP check appt.  -patient given strict pre-e precautions; reviewed that at this gestational age if she has elevated BP tomorrow am the recommendation will be delivery  -discussed third trimester precautions , when to return to MAU.  -patient and visitor agree with plan of care.  -All questions answered  Charlesetta Garibaldi [facility] 07/01/2021, 8:07 PM

## 2021-07-09 ENCOUNTER — Other Ambulatory Visit: Payer: Self-pay | Admitting: Obstetrics and Gynecology

## 2021-07-09 LAB — SARS CORONAVIRUS 2 (TAT 6-24 HRS): SARS Coronavirus 2: NEGATIVE

## 2021-07-12 ENCOUNTER — Inpatient Hospital Stay (HOSPITAL_COMMUNITY)
Admission: AD | Admit: 2021-07-12 | Discharge: 2021-07-16 | DRG: 807 | Disposition: A | Discharge status: Home / Self Care | Payer: No Typology Code available for payment source | Attending: Obstetrics & Gynecology | Admitting: Obstetrics & Gynecology

## 2021-07-12 ENCOUNTER — Inpatient Hospital Stay (HOSPITAL_COMMUNITY): Payer: No Typology Code available for payment source

## 2021-07-12 ENCOUNTER — Other Ambulatory Visit: Payer: Self-pay

## 2021-07-12 ENCOUNTER — Encounter (HOSPITAL_COMMUNITY): Payer: Self-pay | Admitting: Obstetrics and Gynecology

## 2021-07-12 DIAGNOSIS — Z3490 Encounter for supervision of normal pregnancy, unspecified, unspecified trimester: Secondary | ICD-10-CM

## 2021-07-12 DIAGNOSIS — Z3A40 40 weeks gestation of pregnancy: Secondary | ICD-10-CM | POA: Diagnosis not present

## 2021-07-12 DIAGNOSIS — O480 Post-term pregnancy: Secondary | ICD-10-CM | POA: Diagnosis present

## 2021-07-12 HISTORY — DX: Other specified health status: Z78.9

## 2021-07-12 LAB — CBC
HCT: 36 % (ref 36.0–46.0)
Hemoglobin: 11.9 g/dL — ABNORMAL LOW (ref 12.0–15.0)
MCH: 28.3 pg (ref 26.0–34.0)
MCHC: 33.1 g/dL (ref 30.0–36.0)
MCV: 85.7 fL (ref 80.0–100.0)
Platelets: 174 10*3/uL (ref 150–400)
RBC: 4.2 MIL/uL (ref 3.87–5.11)
RDW: 15 % (ref 11.5–15.5)
WBC: 9.1 10*3/uL (ref 4.0–10.5)
nRBC: 0 % (ref 0.0–0.2)

## 2021-07-12 LAB — TYPE AND SCREEN
ABO/RH(D): A POS
Antibody Screen: NEGATIVE

## 2021-07-12 MED ORDER — HYDROXYZINE HCL 25 MG PO TABS: 50.0000 mg | ORAL_TABLET | Freq: Four times a day (QID) | ORAL | Status: DC | PRN

## 2021-07-12 MED ORDER — LACTATED RINGERS IV SOLN: INTRAVENOUS | Status: DC

## 2021-07-12 MED ORDER — OXYTOCIN BOLUS FROM INFUSION
333.0000 mL | Freq: Once | INTRAVENOUS | Status: AC
  Administered 2021-07-14: 02:00:00 333 mL via INTRAVENOUS

## 2021-07-12 MED ORDER — OXYCODONE-ACETAMINOPHEN 5-325 MG PO TABS: 2.0000 | ORAL_TABLET | ORAL | Status: DC | PRN

## 2021-07-12 MED ORDER — BUTORPHANOL TARTRATE 1 MG/ML IJ SOLN: 1.0000 mg | INTRAMUSCULAR | Status: DC | PRN

## 2021-07-12 MED ORDER — ONDANSETRON HCL 4 MG/2ML IJ SOLN: 4.0000 mg | Freq: Four times a day (QID) | INTRAMUSCULAR | Status: DC | PRN

## 2021-07-12 MED ORDER — MISOPROSTOL 25 MCG QUARTER TABLET
25.0000 ug | ORAL_TABLET | ORAL | Status: DC | PRN
  Administered 2021-07-12: 20:00:00 25 ug via VAGINAL
  Filled 2021-07-12 (×2): qty 1

## 2021-07-12 MED ORDER — ACETAMINOPHEN 325 MG PO TABS: 650.0000 mg | ORAL_TABLET | ORAL | Status: DC | PRN

## 2021-07-12 MED ORDER — SOD CITRATE-CITRIC ACID 500-334 MG/5ML PO SOLN: 30.0000 mL | ORAL | Status: DC | PRN

## 2021-07-12 MED ORDER — LACTATED RINGERS IV SOLN: 500.0000 mL | INTRAVENOUS | Status: DC | PRN

## 2021-07-12 MED ORDER — LIDOCAINE HCL (PF) 1 % IJ SOLN
30.0000 mL | INTRAMUSCULAR | Status: DC | PRN
  Filled 2021-07-12: qty 30

## 2021-07-12 MED ORDER — OXYCODONE-ACETAMINOPHEN 5-325 MG PO TABS: 1.0000 | ORAL_TABLET | ORAL | Status: DC | PRN

## 2021-07-12 MED ORDER — TERBUTALINE SULFATE 1 MG/ML IJ SOLN
0.2500 mg | Freq: Once | INTRAMUSCULAR | Status: DC | PRN
  Filled 2021-07-12: qty 1

## 2021-07-12 MED ORDER — OXYTOCIN-SODIUM CHLORIDE 30-0.9 UT/500ML-% IV SOLN
2.5000 [IU]/h | INTRAVENOUS | Status: DC
  Filled 2021-07-12: qty 500

## 2021-07-12 NOTE — H&P (Signed)
Amanda Wilkinson is a 25 y.o. female presenting for IOL post dates. Pregnancy complicated by Horizon screen positive for SMA > SMN1 c*3+80T>G polymorphism at intron 7- "silent carrier" with about 3% chance she is indeed a SMA carrier-genetics consult done. OB History     Gravida  1   Para      Term      Preterm      AB      Living         SAB      IAB      Ectopic      Multiple      Live Births             No past medical history on file. No past surgical history on file. Family History: family history is not on file. Social History:  reports that she has never smoked. She does not have any smokeless tobacco history on file. She reports that she does not drink alcohol and does not use drugs.     Maternal Diabetes: No Genetic Screening: Normal Maternal Ultrasounds/Referrals: Normal Fetal Ultrasounds or other Referrals:  None Maternal Substance Abuse:  No Significant Maternal Medications:  None Significant Maternal Lab Results:  Group B Strep negative Other Comments:  None  Review of Systems  Constitutional:  Negative for fever.  Eyes:  Negative for visual disturbance.  Gastrointestinal:  Negative for abdominal pain.  Neurological:  Negative for headaches.  Maternal Medical History:  Fetal activity: Perceived fetal activity is normal.      There were no vitals taken for this visit. Maternal Exam:  Abdomen: Fetal presentation: vertex  Physical Exam Cardiovascular:     Rate and Rhythm: Normal rate.  Pulmonary:     Effort: Pulmonary effort is normal.    Prenatal labs: ABO, Rh:   Antibody:   Rubella:   RPR:    HBsAg:    HIV:    GBS:   negative 06/14/21  Assessment/Plan: 25 yo G1P0 @ 40 5/7 wks for two stage IOL   Amanda Wilkinson II 07/12/2021, 9:22 AM

## 2021-07-12 NOTE — Progress Notes (Signed)
No changes to H&P per patient history Cx 2/50/high per nurse check with first misoprostol  BSUS>Vtx FHT cat one UCs mild/irregular  D/W two stage IOL All questions answered

## 2021-07-13 ENCOUNTER — Inpatient Hospital Stay (HOSPITAL_COMMUNITY): Payer: No Typology Code available for payment source | Admitting: Anesthesiology

## 2021-07-13 LAB — RPR: RPR Ser Ql: NONREACTIVE

## 2021-07-13 MED ORDER — LIDOCAINE HCL (PF) 1 % IJ SOLN
INTRAMUSCULAR | Status: DC | PRN
  Administered 2021-07-13 (×2): 5 mL via EPIDURAL

## 2021-07-13 MED ORDER — OXYTOCIN-SODIUM CHLORIDE 30-0.9 UT/500ML-% IV SOLN
1.0000 m[IU]/min | INTRAVENOUS | Status: DC
  Administered 2021-07-13: 06:00:00 2 m[IU]/min via INTRAVENOUS
  Filled 2021-07-13: qty 500

## 2021-07-13 MED ORDER — PHENYLEPHRINE 40 MCG/ML (10ML) SYRINGE FOR IV PUSH (FOR BLOOD PRESSURE SUPPORT)
80.0000 ug | PREFILLED_SYRINGE | INTRAVENOUS | Status: DC | PRN
  Filled 2021-07-13 (×2): qty 10

## 2021-07-13 MED ORDER — DIPHENHYDRAMINE HCL 50 MG/ML IJ SOLN: 12.5000 mg | INTRAMUSCULAR | Status: DC | PRN

## 2021-07-13 MED ORDER — PHENYLEPHRINE 40 MCG/ML (10ML) SYRINGE FOR IV PUSH (FOR BLOOD PRESSURE SUPPORT)
80.0000 ug | PREFILLED_SYRINGE | INTRAVENOUS | Status: DC | PRN
  Filled 2021-07-13: qty 10

## 2021-07-13 MED ORDER — EPHEDRINE 5 MG/ML INJ
10.0000 mg | INTRAVENOUS | Status: DC | PRN
  Filled 2021-07-13: qty 2

## 2021-07-13 MED ORDER — FENTANYL-BUPIVACAINE-NACL 0.5-0.125-0.9 MG/250ML-% EP SOLN
12.0000 mL/h | EPIDURAL | Status: DC | PRN
  Administered 2021-07-13: 11:00:00 12 mL/h via EPIDURAL
  Filled 2021-07-13 (×2): qty 250

## 2021-07-13 MED ORDER — LACTATED RINGERS IV SOLN: 500.0000 mL | Freq: Once | INTRAVENOUS | Status: DC

## 2021-07-13 NOTE — Progress Notes (Signed)
Amanda Wilkinson is a 25 y.o. G1P0 at [redacted]w[redacted]d by ultrasound admitted for induction of labor due to [facility].  Subjective: Feeling some pressure  Objective: BP 128/78    Pulse 82    Temp 98.3 F (36.8 C) (Oral)    Resp 18    Ht 5\' 5"  (1.651 m)    Wt 104.1 kg    SpO2 100%    BMI 38.17 kg/m  I/O last 3 completed shifts: In: -  Out: 575 [Urine:575] No intake/output data recorded.  FHT:  FHR: 135 bpm, variability: moderate,  accelerations:  Present,  decelerations:  Absent UC:   regular, every 2 minutes, MVUs adequate SVE:   AL/c/+1  Labs: Lab Results  Component Value Date   WBC 9.1 07/12/2021   HGB 11.9 (L) 07/12/2021   HCT 36.0 07/12/2021   MCV 85.7 07/12/2021   PLT 174 07/12/2021    Assessment / Plan: Induction of labor due to [facility],  progressing well on pitocin  Labor: Progressing normally Preeclampsia:   n/a Fetal Wellbeing:  Category I Pain Control:  Epidural I/D:  n/a Anticipated MOD:  NSVD  [facility] 07/13/2021, 11:55 PM

## 2021-07-13 NOTE — Progress Notes (Signed)
Amanda Wilkinson is a 25 y.o. G1P0 at [redacted]w[redacted]d by ultrasound admitted for induction of labor due to Post dates. Due date 07/07/21.  Subjective: No c/o.  Not feeling CTX  Objective: BP (!) 132/92    Pulse 81    Temp 98.4 F (36.9 C) (Oral)    Resp 18    Ht 5\' 5"  (1.651 m)    Wt 104.1 kg    BMI 38.17 kg/m  No intake/output data recorded. No intake/output data recorded.  FHT:  FHR: 150 bpm, variability: moderate,  accelerations:  Present,  decelerations:  Absent UC:   irregular, every 3-5 minutes SVE:   Dilation: 4 Effacement (%): 90 Station: -2 Exam by:: Parthenia Tellefsen AROM, clear  Labs: Lab Results  Component Value Date   WBC 9.1 07/12/2021   HGB 11.9 (L) 07/12/2021   HCT 36.0 07/12/2021   MCV 85.7 07/12/2021   PLT 174 07/12/2021    Assessment / Plan: Induction of labor due to postterm,  progressing well on pitocin  Labor: Progressing normally Preeclampsia:   n/a Fetal Wellbeing:  Category I Pain Control:  Labor support without medications I/D:  n/a Anticipated MOD:  NSVD  07/14/2021 07/13/2021, 7:38 AM

## 2021-07-13 NOTE — Progress Notes (Signed)
Amanda Wilkinson is a 25 y.o. G1P0 at [redacted]w[redacted]d by ultrasound admitted for induction of labor due to Post dates. Due date 07/07/21.  Subjective: Comfortable with CLEA  Objective: BP (!) 105/54    Pulse 65    Temp 97.7 F (36.5 C) (Oral)    Resp 18    Ht 5\' 5"  (1.651 m)    Wt 104.1 kg    SpO2 100%    BMI 38.17 kg/m  No intake/output data recorded. No intake/output data recorded.  FHT:  FHR: 145 bpm, variability: moderate,  accelerations:  Present,  decelerations:  Absent UC:   irregular, every 2-5 minutes SVE:   Dilation: 4 Effacement (%): 90 Station: -1 Exam by:: A Showfety RN  Labs: Lab Results  Component Value Date   WBC 9.1 07/12/2021   HGB 11.9 (L) 07/12/2021   HCT 36.0 07/12/2021   MCV 85.7 07/12/2021   PLT 174 07/12/2021    Assessment / Plan: Induction of labor due to postterm,  progressing well on pitocin  Labor: Progressing normally Preeclampsia:   n/a Fetal Wellbeing:  Category I Pain Control:  Epidural I/D:  n/a Anticipated MOD:  NSVD  07/14/2021 07/13/2021, 1:55 PM

## 2021-07-13 NOTE — Progress Notes (Signed)
Amanda Wilkinson is a 25 y.o. G1P0 at [redacted]w[redacted]d by ultrasound admitted for induction of labor due to Post dates. Due date 07/13/21.  Subjective: Comfortable with CLEA  Objective: BP 124/77    Pulse 75    Temp 98.3 F (36.8 C) (Oral)    Resp 18    Ht 5\' 5"  (1.651 m)    Wt 104.1 kg    SpO2 100%    BMI 38.17 kg/m  I/O last 3 completed shifts: In: -  Out: 575 [Urine:575] No intake/output data recorded.  FHT:  FHR: 155 bpm, variability: moderate,  accelerations:  Present,  decelerations:  Absent UC:   regular, every 2-3 minutes SVE:   Dilation: 7 Effacement (%): 100 Station: 0 Exam by:: Dr [email or web address] IUPC placed  Labs: Lab Results  Component Value Date   WBC 9.1 07/12/2021   HGB 11.9 (L) 07/12/2021   HCT 36.0 07/12/2021   MCV 85.7 07/12/2021   PLT 174 07/12/2021    Assessment / Plan: Induction of labor due to postterm,  progressing well on pitocin  Labor: Protracted active phase.  IUPC placed.  Will continue to increase pitocin to adequate MVUs Preeclampsia:   n/a Fetal Wellbeing:  Category I Pain Control:  Epidural I/D:  n/a Anticipated MOD:  NSVD  07/14/2021 07/13/2021, 8:46 PM

## 2021-07-13 NOTE — Progress Notes (Signed)
Amanda Wilkinson is a 25 y.o. G1P0 at [redacted]w[redacted]d by ultrasound admitted for induction of labor due to Post dates. Due date 07/13/21.  Subjective: Comfortable with CLEA  Objective: BP 122/82    Pulse 74    Temp 98.2 F (36.8 C) (Oral)    Resp 18    Ht 5\' 5"  (1.651 m)    Wt 104.1 kg    SpO2 100%    BMI 38.17 kg/m  No intake/output data recorded. No intake/output data recorded.  FHT:  FHR: 140 bpm, variability: moderate,  accelerations:  Present,  decelerations:  Absent UC:   regular, every 3 minutes SVE:   Dilation: 7 Effacement (%): 100 Station: 0 Exam by:: Dr [email or web address]  Labs: Lab Results  Component Value Date   WBC 9.1 07/12/2021   HGB 11.9 (L) 07/12/2021   HCT 36.0 07/12/2021   MCV 85.7 07/12/2021   PLT 174 07/12/2021    Assessment / Plan: Induction of labor due to postterm,  progressing well on pitocin  Labor: Progressing normally Preeclampsia:   n/a Fetal Wellbeing:  Category I Pain Control:  Epidural I/D:  n/a Anticipated MOD:  NSVD  07/14/2021 07/13/2021, 5:34 PM

## 2021-07-13 NOTE — Anesthesia Preprocedure Evaluation (Signed)
Anesthesia Evaluation  Patient identified by MRN, date of birth, ID band Patient awake    Reviewed: Allergy & Precautions, NPO status , Patient's Chart, lab work & pertinent test results  Airway Mallampati: III  TM Distance: >3 FB Neck ROM: Full    Dental no notable dental hx. (+) Dental Advisory Given   Pulmonary neg pulmonary ROS,    Pulmonary exam normal        Cardiovascular negative cardio ROS Normal cardiovascular exam     Neuro/Psych negative neurological ROS  negative psych ROS   GI/Hepatic negative GI ROS, Neg liver ROS,   Endo/Other  negative endocrine ROS  Renal/GU negative Renal ROS  negative genitourinary   Musculoskeletal negative musculoskeletal ROS (+)   Abdominal   Peds negative pediatric ROS (+)  Hematology negative hematology ROS (+)   Anesthesia Other Findings   Reproductive/Obstetrics negative OB ROS                             Anesthesia Physical Anesthesia Plan  ASA: 2  Anesthesia Plan: Epidural   Post-op Pain Management:    Induction:   PONV Risk Score and Plan:   Airway Management Planned:   Additional Equipment:   Intra-op Plan:   Post-operative Plan:   Informed Consent: I have reviewed the patients History and Physical, chart, labs and discussed the procedure including the risks, benefits and alternatives for the proposed anesthesia with the patient or authorized representative who has indicated his/her understanding and acceptance.       Plan Discussed with: Anesthesiologist  Anesthesia Plan Comments:         Anesthesia Quick Evaluation

## 2021-07-13 NOTE — Anesthesia Procedure Notes (Signed)
Epidural Patient location during procedure: OB Start time: 07/13/2021 10:41 AM End time: 07/13/2021 10:53 AM  Staffing Anesthesiologist: Heather Roberts, MD Performed: anesthesiologist   Preanesthetic Checklist Completed: patient identified, IV checked, site marked, risks and benefits discussed, monitors and equipment checked, pre-op evaluation and timeout performed  Epidural Patient position: sitting Prep: DuraPrep Patient monitoring: heart rate, cardiac monitor, continuous pulse ox and blood pressure Approach: midline Location: L2-L3 Injection technique: LOR saline  Needle:  Needle type: Tuohy  Needle gauge: 17 G Needle length: 9 cm Needle insertion depth: 7 cm Catheter size: 20 Guage Catheter at skin depth: 11 cm Test dose: negative and Other  Assessment Events: blood not aspirated, injection not painful, no injection resistance and negative IV test  Additional Notes Informed consent obtained prior to proceeding including risk of failure, 1% risk of PDPH, risk of minor discomfort and bruising.  Discussed rare but serious complications including epidural abscess, permanent nerve injury, epidural hematoma.  Discussed alternatives to epidural analgesia and patient desires to proceed.  Timeout performed pre-procedure verifying patient name, procedure, and platelet count.  Patient tolerated procedure well.

## 2021-07-14 LAB — CBC
HCT: 32.9 % — ABNORMAL LOW (ref 36.0–46.0)
Hemoglobin: 10.6 g/dL — ABNORMAL LOW (ref 12.0–15.0)
MCH: 27.2 pg (ref 26.0–34.0)
MCHC: 32.2 g/dL (ref 30.0–36.0)
MCV: 84.4 fL (ref 80.0–100.0)
Platelets: 193 10*3/uL (ref 150–400)
RBC: 3.9 MIL/uL (ref 3.87–5.11)
RDW: 14.8 % (ref 11.5–15.5)
WBC: 18.4 10*3/uL — ABNORMAL HIGH (ref 4.0–10.5)
nRBC: 0 % (ref 0.0–0.2)

## 2021-07-14 MED ORDER — ONDANSETRON HCL 4 MG PO TABS: 4.0000 mg | ORAL_TABLET | ORAL | Status: DC | PRN

## 2021-07-14 MED ORDER — OXYCODONE-ACETAMINOPHEN 5-325 MG PO TABS
1.0000 | ORAL_TABLET | ORAL | Status: DC | PRN
  Administered 2021-07-15: 05:00:00 1 via ORAL
  Filled 2021-07-14: qty 1

## 2021-07-14 MED ORDER — DIBUCAINE (PERIANAL) 1 % EX OINT
1.0000 "application " | TOPICAL_OINTMENT | CUTANEOUS | Status: DC | PRN
  Administered 2021-07-15: 1 via RECTAL
  Filled 2021-07-14: qty 28

## 2021-07-14 MED ORDER — WITCH HAZEL-GLYCERIN EX PADS: 1.0000 "application " | MEDICATED_PAD | CUTANEOUS | Status: DC | PRN

## 2021-07-14 MED ORDER — COCONUT OIL OIL
1.0000 "application " | TOPICAL_OIL | Status: DC | PRN
  Administered 2021-07-14: 1 via TOPICAL

## 2021-07-14 MED ORDER — DIPHENHYDRAMINE HCL 25 MG PO CAPS: 25.0000 mg | ORAL_CAPSULE | Freq: Four times a day (QID) | ORAL | Status: DC | PRN

## 2021-07-14 MED ORDER — SIMETHICONE 80 MG PO CHEW: 80.0000 mg | CHEWABLE_TABLET | ORAL | Status: DC | PRN

## 2021-07-14 MED ORDER — IBUPROFEN 600 MG PO TABS
600.0000 mg | ORAL_TABLET | Freq: Four times a day (QID) | ORAL | Status: DC
  Administered 2021-07-14 – 2021-07-16 (×10): 600 mg via ORAL
  Filled 2021-07-14 (×10): qty 1

## 2021-07-14 MED ORDER — OXYCODONE-ACETAMINOPHEN 5-325 MG PO TABS: 2.0000 | ORAL_TABLET | ORAL | Status: DC | PRN

## 2021-07-14 MED ORDER — BENZOCAINE-MENTHOL 20-0.5 % EX AERO
1.0000 "application " | INHALATION_SPRAY | CUTANEOUS | Status: DC | PRN
  Administered 2021-07-15: 1 via TOPICAL
  Filled 2021-07-14: qty 56

## 2021-07-14 MED ORDER — ACETAMINOPHEN 325 MG PO TABS
650.0000 mg | ORAL_TABLET | ORAL | Status: DC | PRN
  Administered 2021-07-15 – 2021-07-16 (×3): 650 mg via ORAL
  Filled 2021-07-14 (×3): qty 2

## 2021-07-14 MED ORDER — ZOLPIDEM TARTRATE 5 MG PO TABS: 5.0000 mg | ORAL_TABLET | Freq: Every evening | ORAL | Status: DC | PRN

## 2021-07-14 MED ORDER — ONDANSETRON HCL 4 MG/2ML IJ SOLN: 4.0000 mg | INTRAMUSCULAR | Status: DC | PRN

## 2021-07-14 MED ORDER — SENNOSIDES-DOCUSATE SODIUM 8.6-50 MG PO TABS
2.0000 | ORAL_TABLET | Freq: Every day | ORAL | Status: DC
  Administered 2021-07-15 – 2021-07-16 (×2): 2 via ORAL
  Filled 2021-07-14 (×2): qty 2

## 2021-07-14 MED ORDER — PRENATAL MULTIVITAMIN CH
1.0000 | ORAL_TABLET | Freq: Every day | ORAL | Status: DC
  Administered 2021-07-14 – 2021-07-16 (×3): 1 via ORAL
  Filled 2021-07-14 (×3): qty 1

## 2021-07-14 MED ORDER — TETANUS-DIPHTH-ACELL PERTUSSIS 5-2.5-18.5 LF-MCG/0.5 IM SUSY: 0.5000 mL | PREFILLED_SYRINGE | Freq: Once | INTRAMUSCULAR | Status: DC

## 2021-07-14 NOTE — Lactation Note (Signed)
This note was copied from a baby's chart. Lactation Consultation Note  [name] [id number] Date: 07/14/2021 Reason for consult: Initial assessment;Primapara;1st time breastfeeding;Term;Breastfeeding assistance;Other (Comment) (P 1. wake and rooting when LC entered the room) Age:23 hours LC offered to assist with latch and mom receptive.  Baby latched easily on the left breast with depth/ pinching at 1st , and after the upper lips were flipped to flanged position per mom more comfortable.  Mom aware to feed with feeding cues ( 8-12 times day ).   Maternal Data Has patient been taught Hand Expression?: Yes Does the patient have breastfeeding experience prior to this delivery?: No  Feeding Mother's Current Feeding Choice: Breast Milk  LATCH Score Latch: Grasps breast easily, tongue down, lips flanged, rhythmical sucking.  Audible Swallowing: Spontaneous and intermittent  Type of Nipple: Everted at rest and after stimulation (some areola edema)  Comfort (Breast/Nipple): Soft / non-tender  Hold (Positioning): Assistance needed to correctly position infant at breast and maintain latch.  LATCH Score: 9   Lactation Tools Discussed/Used    Interventions Interventions: Breast feeding basics reviewed;Assisted with latch;Skin to skin;Breast massage;Hand express;Reverse pressure;Breast compression;Adjust position;Support pillows;Position options;Education;LC Services brochure  Discharge Pump: Personal;DEBP WIC Program: No  Consult Status Consult Status: Follow-up Date: 07/14/21 Follow-up type: In-patient    Matilde Sprang Mae Cianci 07/14/2021, 9:39 AM

## 2021-07-14 NOTE — Progress Notes (Signed)
Called Dr. Langston Masker to notify her that the  patients blood pressure was 151/87 pulse 74 and then 125/88 69 . Dr Langston Masker gave and order for a cmp to be drawn on 12/22 @ 500. No orders were given for blood pressure parameters states that she would like to continue to be called with blood pressures.

## 2021-07-14 NOTE — Anesthesia Postprocedure Evaluation (Signed)
Anesthesia Post Note  Patient: Amanda Wilkinson  Procedure(s) Performed: AN AD HOC LABOR EPIDURAL     Patient location during evaluation: Mother Baby Anesthesia Type: Epidural Level of consciousness: awake Pain management: satisfactory to patient Vital Signs Assessment: post-procedure vital signs reviewed and stable Respiratory status: spontaneous breathing Cardiovascular status: stable Anesthetic complications: no   No notable events documented.  Last Vitals:  Vitals:   07/14/21 0630 07/14/21 1025  BP: 125/88 125/73  Pulse: 69 66  Resp: 18 20  Temp: 37.2 C 37.1 C  SpO2: 100% 100%    Last Pain:  Vitals:   07/14/21 1025  TempSrc: Oral  PainSc: 0-No pain   Pain Goal:                   [employer]

## 2021-07-14 NOTE — Progress Notes (Signed)
Post Partum Day 0 - delivery c/b shoulder dystocia.  Subjective: no complaints, up ad lib, voiding, and tolerating PO  Objective: Blood pressure 125/88, pulse 69, temperature 98.9 F (37.2 C), temperature source Oral, resp. rate 18, height 5\' 5"  (1.651 m), weight 104.1 kg, SpO2 100 %.  Physical Exam:  General: alert and cooperative Lochia: appropriate Uterine Fundus: firm Incision: healing well DVT Evaluation: No evidence of DVT seen on physical exam.  Recent Labs    07/12/21 1858 07/14/21 0719  HGB 11.9* 10.6*  HCT 36.0 32.9*    Assessment/Plan: Breastfeeding and Circumcision prior to discharge Patient desires circ. However, infant is < 8 hours old and has not voided yet. Peds would like to defer circ until tomorrow.  Patient is meeting all immediate pp goals thus far.  Mild range Bps immediately pp have resolved. Patient remains asymptomatic.    LOS: 2 days   07/16/21 07/14/2021, 9:27 AM

## 2021-07-15 LAB — COMPREHENSIVE METABOLIC PANEL
ALT: 17 U/L (ref 0–44)
AST: 33 U/L (ref 15–41)
Albumin: 2.3 g/dL — ABNORMAL LOW (ref 3.5–5.0)
Alkaline Phosphatase: 124 U/L (ref 38–126)
Anion gap: 7 (ref 5–15)
BUN: 7 mg/dL (ref 6–20)
CO2: 21 mmol/L — ABNORMAL LOW (ref 22–32)
Calcium: 8.6 mg/dL — ABNORMAL LOW (ref 8.9–10.3)
Chloride: 107 mmol/L (ref 98–111)
Creatinine, Ser: 0.8 mg/dL (ref 0.44–1.00)
GFR, Estimated: >60 mL/min (ref >60)
Glucose, Bld: 72 mg/dL (ref 70–99)
Potassium: 3.2 mmol/L — ABNORMAL LOW (ref 3.5–5.1)
Sodium: 135 mmol/L (ref 135–145)
Total Bilirubin: 0.3 mg/dL (ref 0.3–1.2)
Total Protein: 5 g/dL — ABNORMAL LOW (ref 6.5–8.1)

## 2021-07-15 NOTE — Social Work (Signed)
[name] was referred for history of depression/anxiety.  * Referral screened out by Clinical Social Worker because none of the following criteria appear to apply: ~ History of anxiety/depression during this pregnancy, or of post-partum depression following prior delivery. ~ Diagnosis of anxiety and/or depression within last 3 years OR * [name]'s symptoms currently being treated with medication and/or therapy. Per chart review, [name] received therapy for her symptoms.   Please contact the Clinical Social Worker if needs arise, by [facility] request, or if [name] scores greater than 9/yes to question 10 on Edinburgh Postpartum Depression Screen.   Vivi Barrack, MSW, LCSW [facility]  Clinical Social Worker  [phone] 07/15/2021  8:15 AM

## 2021-07-15 NOTE — Lactation Note (Signed)
This note was copied from a baby's chart. Lactation Consultation Note  [name] [id number] Date: 07/15/2021  P1, term female infant, -4% weight loss. Per RNHelmut Muster) mom decline LC services tonight she will call if changes her mind for [facility] assistance. Age:23 hours  Maternal Data    Feeding Nipple Type: Slow - flow  LATCH Score Latch: Too sleepy or reluctant, no latch achieved, no sucking elicited.  Audible Swallowing: None  Type of Nipple: Everted at rest and after stimulation (short shaft)  Comfort (Breast/Nipple): Soft / non-tender  Hold (Positioning): Assistance needed to correctly position infant at breast and maintain latch.  LATCH Score: 5   Lactation Tools Discussed/Used    Interventions Interventions: Breast feeding basics reviewed;Assisted with latch;Skin to skin;Breast massage;Breast compression;Adjust position;Support pillows;Position options  Discharge    Consult Status      Danelle Earthly 07/15/2021, 10:19 PM

## 2021-07-15 NOTE — Progress Notes (Signed)
PPD # 1  Doing well.   BP 134/82 (BP Location: Left Arm)    Pulse 70    Temp 97.9 F (36.6 C) (Oral)    Resp 18    Ht 5\' 5"  (1.651 m)    Wt 104.1 kg    SpO2 100%    BMI 38.17 kg/m  Results for orders placed or performed during the hospital encounter of 07/12/21 (from the past 24 hour(s))  Comprehensive metabolic panel     Status: Abnormal   Collection Time: 07/15/21  6:00 AM  Result Value Ref Range   Sodium 135 135 - 145 mmol/L   Potassium 3.2 (L) 3.5 - 5.1 mmol/L   Chloride 107 98 - 111 mmol/L   CO2 21 (L) 22 - 32 mmol/L   Glucose, Bld 72 70 - 99 mg/dL   BUN 7 6 - 20 mg/dL   Creatinine, Ser 07/17/21 0.44 - 1.00 mg/dL   Calcium 8.6 (L) 8.9 - 10.3 mg/dL   Total Protein 5.0 (L) 6.5 - 8.1 g/dL   Albumin 2.3 (L) 3.5 - 5.0 g/dL   AST 33 15 - 41 U/L   ALT 17 0 - 44 U/L   Alkaline Phosphatase 124 38 - 126 U/L   Total Bilirubin 0.3 0.3 - 1.2 mg/dL   GFR, Estimated 4.09 >81 mL/min   Anion gap 7 5 - 15   Uterus non tender  Lochia WNL  PPD # 1  Doing well Routine care Plan circ today  Discharge home tomorrow

## 2021-07-15 NOTE — Lactation Note (Signed)
This note was copied from a baby's chart. Lactation Consultation Note RN stated mom having difficulty latching. Mom stated she has been having difficulty but she got him latched the last feeding and he BF for 20 min. Assessed breast. Mom is semi flat/short shaft nipples. Has some edema. Encouraged mom to wear shells in am. Encouraged mom to pre-pump to evert nipples for deeper latch. Finger stimulation helpful as well. Discussed feeding positions, baby's body alignment. Mom demonstrated hand expression. No colostrum noted at this time. Mom stated she has seen some earlier. Mom thankful for advise. Encouraged mom to rest while baby is resting. Call for assistance as needed.  [name] [id number] Date: 07/15/2021 Reason for consult: Follow-up assessment;Difficult latch;Term;Primapara Age:62 hours  Maternal Data Has patient been taught Hand Expression?: Yes Does the patient have breastfeeding experience prior to this delivery?: No  Feeding    LATCH Score Latch: Repeated attempts needed to sustain latch, nipple held in mouth throughout feeding, stimulation needed to elicit sucking reflex.  Audible Swallowing: A few with stimulation  Type of Nipple: Flat (semi flat. everts w/stimulation)  Comfort (Breast/Nipple): Soft / non-tender  Hold (Positioning): Assistance needed to correctly position infant at breast and maintain latch.  LATCH Score: 7   Lactation Tools Discussed/Used    Interventions Interventions: Position options;Breast massage;Hand express;Pre-pump if needed;Shells;Hand pump  Discharge    Consult Status Consult Status: Follow-up Date: 07/15/21 Follow-up type: In-patient    Charyl Dancer 07/15/2021, 1:49 AM

## 2021-07-16 MED ORDER — IBUPROFEN 600 MG PO TABS: 600.0000 mg | ORAL_TABLET | Freq: Four times a day (QID) | ORAL | 0 refills | Status: AC | PRN

## 2021-07-16 NOTE — Discharge Summary (Signed)
Postpartum Discharge Summary       [name] DOB: [birth year] MRN: [id number]  Date of admission: 07/12/2021 Delivery date:07/14/2021  Delivering provider: Linda Hedges  Date of discharge: 07/16/2021  Admitting diagnosis: Pregnancy [Z34.90] Intrauterine pregnancy: [redacted]w[redacted]d    Secondary diagnosis:  Principal Problem:   Pregnancy  Additional problems:      Discharge diagnosis: Term Pregnancy Delivered                                              Post partum procedures:   Augmentation: AROM, Pitocin, and Cytotec Complications: None  Hospital course: Induction of Labor With Vaginal Delivery   25y.o. yo G1P0 at 475w2das admitted to the hospital 07/12/2021 for induction of labor.  Indication for induction: Postdates.  Patient had an uncomplicated labor course as follows: Membrane Rupture Time/Date: 7:35 AM ,07/13/2021   Delivery Method:Vaginal, Spontaneous  Episiotomy: None  Lacerations:  2nd degree;Periurethral  Details of delivery can be found in separate delivery note.  Patient had a routine postpartum course. Patient is discharged home 07/16/21.  Newborn Data: Birth date:07/14/2021  Birth time:2:15 AM  Gender:Female  Living status:Living  Apgars:2 ,7  Weight:3997 g   Magnesium Sulfate received: No BMZ received: No Rhophylac:N/A MMR:N/A T-DaP:Given prenatally Flu: No Transfusion:No  Physical exam  Vitals:   07/15/21 0525 07/15/21 1357 07/15/21 2035 07/16/21 0615  BP: 134/82 128/87 119/80 140/76  Pulse: 70 78 65 64  Resp: _0 Temp: 97.9 F (36.6 C) 98.7 F (37.1 C) 98.1 F (36.7 C) 98.7 F (37.1 C)  TempSrc: Oral Oral Oral Oral  SpO2: 100% 100% 100% 100%  Weight:      Height:       General: alert, cooperative, and no distress Lochia: appropriate Uterine Fundus: firm Incision: N/A DVT Evaluation: No evidence of DVT seen on physical exam. Labs: Lab Results  Component Value Date   WBC 18.4 (H) 07/14/2021   HGB 10.6 (L)  07/14/2021   HCT 32.9 (L) 07/14/2021   MCV 84.4 07/14/2021   PLT 193 07/14/2021   CMP Latest Ref Rng & Units 07/15/2021  Glucose 70 - 99 mg/dL 72  BUN 6 - 20 mg/dL 7  Creatinine 0.44 - 1.00 mg/dL 0.80  Sodium 135 - 145 mmol/L 135  Potassium 3.5 - 5.1 mmol/L 3.2(L)  Chloride 98 - 111 mmol/L 107  CO2 22 - 32 mmol/L 21(L)  Calcium 8.9 - 10.3 mg/dL 8.6(L)  Total Protein 6.5 - 8.1 g/dL 5.0(L)  Total Bilirubin 0.3 - 1.2 mg/dL 0.3  Alkaline Phos 38 - 126 U/L 124  AST 15 - 41 U/L 33  ALT 0 - 44 U/L 17   Edinburgh Score: Edinburgh Postnatal Depression Scale Screening Tool 07/14/2021  I have been able to laugh and see the funny side of things. 0  I have looked forward with enjoyment to things. 0  I have blamed myself unnecessarily when things went wrong. 1  I have been anxious or worried for no good reason. 0  I have felt scared or panicky for no good reason. 0  Things have been getting on top of me. 0  I have been so unhappy that I have had difficulty sleeping. 0  I have felt sad or miserable. 0  I have been so unhappy that I have been crying. 0  The thought of harming myself has occurred to me. 0  Edinburgh Postnatal Depression Scale Total 1      After visit meds:  Allergies as of 07/16/2021   No Known Allergies      Medication List     TAKE these medications    acetaminophen 500 MG tablet Commonly known as: TYLENOL Take 500 mg by mouth every 6 (six) hours as needed for mild pain or moderate pain.   cyclobenzaprine 10 MG tablet Commonly known as: FLEXERIL Take 1 tablet (10 mg total) by mouth 2 (two) times daily as needed for muscle spasms.   ferrous sulfate 325 (65 FE) MG tablet Take 325 mg by mouth daily with breakfast.   ibuprofen 600 MG tablet Commonly known as: ADVIL Take 1 tablet (600 mg total) by mouth every 6 (six) hours as needed for moderate pain, mild pain or cramping.   prenatal multivitamin Tabs tablet Take 1 tablet by mouth daily at 12 noon.          Discharge home in stable condition Infant Feeding: Breast Infant Disposition:home with mother Discharge instruction: per After Visit Summary and Postpartum booklet. Activity: Advance as tolerated. Pelvic rest for 6 weeks.  Diet: routine diet Anticipated Birth Control: Unsure Postpartum Appointment:6 weeks Additional Postpartum F/U:    Future Appointments:No future appointments. Follow up Visit:      07/16/2021 Luz Lex, MD

## 2021-07-20 ENCOUNTER — Other Ambulatory Visit: Payer: Self-pay

## 2021-07-20 ENCOUNTER — Encounter (HOSPITAL_COMMUNITY): Payer: Self-pay | Admitting: Obstetrics and Gynecology

## 2021-07-20 ENCOUNTER — Inpatient Hospital Stay (HOSPITAL_COMMUNITY)
Admission: AD | Admit: 2021-07-20 | Discharge: 2021-07-22 | DRG: 776 | Disposition: A | Discharge status: Home / Self Care | Payer: No Typology Code available for payment source | Source: Ambulatory Visit | Attending: Obstetrics and Gynecology | Admitting: Obstetrics and Gynecology

## 2021-07-20 DIAGNOSIS — O1415 Severe pre-eclampsia, complicating the puerperium: Secondary | ICD-10-CM | POA: Diagnosis present

## 2021-07-20 DIAGNOSIS — R030 Elevated blood-pressure reading, without diagnosis of hypertension: Secondary | ICD-10-CM | POA: Diagnosis present

## 2021-07-20 DIAGNOSIS — Z20822 Contact with and (suspected) exposure to covid-19: Secondary | ICD-10-CM | POA: Diagnosis present

## 2021-07-20 LAB — COMPREHENSIVE METABOLIC PANEL
ALT: 98 U/L — ABNORMAL HIGH (ref 0–44)
AST: 44 U/L — ABNORMAL HIGH (ref 15–41)
Albumin: 2.9 g/dL — ABNORMAL LOW (ref 3.5–5.0)
Alkaline Phosphatase: 111 U/L (ref 38–126)
Anion gap: 7 (ref 5–15)
BUN: 6 mg/dL (ref 6–20)
CO2: 25 mmol/L (ref 22–32)
Calcium: 9.1 mg/dL (ref 8.9–10.3)
Chloride: 108 mmol/L (ref 98–111)
Creatinine, Ser: 0.71 mg/dL (ref 0.44–1.00)
GFR, Estimated: >60 mL/min (ref >60)
Glucose, Bld: 79 mg/dL (ref 70–99)
Potassium: 4 mmol/L (ref 3.5–5.1)
Sodium: 140 mmol/L (ref 135–145)
Total Bilirubin: 0.4 mg/dL (ref 0.3–1.2)
Total Protein: 6.3 g/dL — ABNORMAL LOW (ref 6.5–8.1)

## 2021-07-20 LAB — PROTEIN / CREATININE RATIO, URINE
Creatinine, Urine: 45.76 mg/dL
Protein Creatinine Ratio: 1.51 mg/mg{Cre} — ABNORMAL HIGH (ref 0.00–0.15)
Total Protein, Urine: 69 mg/dL

## 2021-07-20 LAB — CBC
HCT: 33.7 % — ABNORMAL LOW (ref 36.0–46.0)
Hemoglobin: 11.1 g/dL — ABNORMAL LOW (ref 12.0–15.0)
MCH: 27.8 pg (ref 26.0–34.0)
MCHC: 32.9 g/dL (ref 30.0–36.0)
MCV: 84.5 fL (ref 80.0–100.0)
Platelets: 289 10*3/uL (ref 150–400)
RBC: 3.99 MIL/uL (ref 3.87–5.11)
RDW: 14.6 % (ref 11.5–15.5)
WBC: 8.4 10*3/uL (ref 4.0–10.5)
nRBC: 0 % (ref 0.0–0.2)

## 2021-07-20 LAB — URINALYSIS, ROUTINE W REFLEX MICROSCOPIC
Bilirubin Urine: NEGATIVE
Glucose, UA: NEGATIVE mg/dL
Ketones, ur: 5 mg/dL — AB
Nitrite: NEGATIVE
Protein, ur: 100 mg/dL — AB
Specific Gravity, Urine: 1.01 (ref 1.005–1.030)
pH: 8 (ref 5.0–8.0)

## 2021-07-20 LAB — RESP PANEL BY RT-PCR (FLU A&B, COVID) ARPGX2
Influenza A by PCR: NEGATIVE
Influenza B by PCR: NEGATIVE
SARS Coronavirus 2 by RT PCR: NEGATIVE

## 2021-07-20 MED ORDER — LABETALOL HCL 5 MG/ML IV SOLN: 40.0000 mg | INTRAVENOUS | Status: DC | PRN

## 2021-07-20 MED ORDER — OXYCODONE HCL 5 MG PO TABS
5.0000 mg | ORAL_TABLET | Freq: Once | ORAL | Status: AC
  Administered 2021-07-20: 18:00:00 5 mg via ORAL
  Filled 2021-07-20: qty 1

## 2021-07-20 MED ORDER — METOCLOPRAMIDE HCL 5 MG/ML IJ SOLN
10.0000 mg | Freq: Once | INTRAMUSCULAR | Status: AC
  Administered 2021-07-20: 21:00:00 10 mg via INTRAVENOUS
  Filled 2021-07-20: qty 2

## 2021-07-20 MED ORDER — LACTATED RINGERS IV SOLN: INTRAVENOUS | Status: DC

## 2021-07-20 MED ORDER — LABETALOL HCL 5 MG/ML IV SOLN
20.0000 mg | INTRAVENOUS | Status: DC | PRN
  Administered 2021-07-20: 18:00:00 20 mg via INTRAVENOUS
  Filled 2021-07-20 (×2): qty 4

## 2021-07-20 MED ORDER — HYDRALAZINE HCL 20 MG/ML IJ SOLN
10.0000 mg | INTRAMUSCULAR | Status: DC | PRN
  Administered 2021-07-20: 19:00:00 10 mg via INTRAVENOUS
  Filled 2021-07-20: qty 1

## 2021-07-20 MED ORDER — LABETALOL HCL 5 MG/ML IV SOLN
80.0000 mg | INTRAVENOUS | Status: DC | PRN
  Administered 2021-07-20: 18:00:00 80 mg via INTRAVENOUS
  Filled 2021-07-20: qty 16

## 2021-07-20 MED ORDER — HYDRALAZINE HCL 20 MG/ML IJ SOLN: 10.0000 mg | INTRAMUSCULAR | Status: DC | PRN

## 2021-07-20 MED ORDER — MAGNESIUM SULFATE 40 GM/1000ML IV SOLN
2.0000 g/h | INTRAVENOUS | Status: AC
  Administered 2021-07-21: 06:00:00 2 g/h via INTRAVENOUS
  Filled 2021-07-20 (×2): qty 1000

## 2021-07-20 MED ORDER — LABETALOL HCL 5 MG/ML IV SOLN: 20.0000 mg | INTRAVENOUS | Status: DC | PRN

## 2021-07-20 MED ORDER — DIPHENHYDRAMINE HCL 50 MG/ML IJ SOLN
12.5000 mg | Freq: Once | INTRAMUSCULAR | Status: AC
  Administered 2021-07-20: 21:00:00 12.5 mg via INTRAVENOUS
  Filled 2021-07-20: qty 1

## 2021-07-20 MED ORDER — LABETALOL HCL 5 MG/ML IV SOLN
40.0000 mg | INTRAVENOUS | Status: DC | PRN
  Administered 2021-07-20: 18:00:00 40 mg via INTRAVENOUS
  Filled 2021-07-20: qty 8

## 2021-07-20 MED ORDER — LABETALOL HCL 5 MG/ML IV SOLN: 80.0000 mg | INTRAVENOUS | Status: DC | PRN

## 2021-07-20 MED ORDER — MAGNESIUM SULFATE BOLUS VIA INFUSION
4.0000 g | Freq: Once | INTRAVENOUS | Status: AC
  Administered 2021-07-20: 18:00:00 4 g via INTRAVENOUS
  Filled 2021-07-20: qty 1000

## 2021-07-20 MED ORDER — NIFEDIPINE ER OSMOTIC RELEASE 30 MG PO TB24
30.0000 mg | ORAL_TABLET | Freq: Every day | ORAL | Status: DC
  Administered 2021-07-20 – 2021-07-21 (×2): 30 mg via ORAL
  Filled 2021-07-20 (×2): qty 1

## 2021-07-20 NOTE — H&P (Signed)
OB History and Physical   Adanna Zuckerman Oliveri is a 25 y.o. female G1P1001 presenting for elevated BP and headache in the postpartum period.    She is s/p uncomplicated vaginal delivery following postdates IOL on 07/14/21.  Postpartum course has been uncomplicated but she had onset of headache yesterday that was not improved with tylenol.  She took BP with home wrist cuff and was in severe range. At the office today BP was 158/100 and she was instructed to present to MAU.  Headache is periorbital. She denies vision changes, denies RUQ or epigastric pain   Past Medical History:  Diagnosis Date   Medical history non-contributory    History reviewed. No pertinent surgical history. Family History: family history includes Heart disease in her father. Social History:  reports that she has never smoked. She does not have any smokeless tobacco history on file. She reports that she does not drink alcohol and does not use drugs.   Review of Systems See HPI. Denies fever, chills, SOB, CP.  History   Blood pressure (!) 148/88, pulse 90, temperature 98.2 F (36.8 C), temperature source Oral, resp. rate 18, height 5' 5.5" (1.664 m), weight 95.2 kg, SpO2 100 %, unknown if currently breastfeeding. Exam Physical Exam  Gen: alert, well appearing, no distress Chest: nonlabored breathing CV: no peripheral edema Abdomen: soft, nontender Ext: no evidence of DVT   Prenatal labs: ABO, Rh: --/--/A POS (12/19 1858) Antibody: NEG (12/19 1858) Rubella: Immune (05/10 0000) RPR: NON REACTIVE (12/19 1858)  HBsAg: Negative (05/10 0000)  HIV: Non-reactive (05/10 0000)  GBS: Negative/-- (11/21 0000)   Assessment/Plan: Admit to antepartum for postpartum preeclampsia Antihypertensive protocol for severe range BP. Patient has received multiple doses of IV labetalol.  Will add oral antihypertensive once BP stabilizes. Magnesium for seizure prophylaxis for 24 hrs CMP with mild elevation in AST/ALT to 44/98.   Other labs WNL, P/C ratio pend Trial oxycodone for headache.  Consider benadryl/reglan if no improvement.  Lyn Henri 07/20/2021, 7:54 PM

## 2021-07-20 NOTE — MAU Note (Signed)
Presents stating she was sent from MD office for BP evaluation.  Denies BP elevations during pregnancy.  Reports current H/A, denies epigastric pain and visual disturbances. S/P NVD 07/14/2021

## 2021-07-20 NOTE — MAU Provider Note (Addendum)
History     CSN: [id number]  Arrival date and time: 07/20/21 1607  Chief Complaint  Patient presents with   Headache   BP Evaluation   25 y.o. G1P1 s/p SVD 6 days ago sent from office for elevated BP. Reports left peri-orbital HA since last night not improved by Tylenol. Rates pain 6/10Denies visual disturbances, RUQ pain, SOB, and CP. She developed elevated BPs shortly after delivery. 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   OB History     Gravida  1   Para      Term      Preterm      AB      Living         SAB      IAB      Ectopic      Multiple      Live Births              Past Medical History:  Diagnosis Date   Medical history non-contributory     History reviewed. No pertinent surgical history.  Family History  Problem Relation Age of Onset   Heart disease Father     Social History   Tobacco Use   Smoking status: Never  Substance Use Topics   Alcohol use: No   Drug use: Never    Allergies: No Known Allergies  Medications Prior to Admission  Medication Sig Dispense Refill Last Dose   Prenatal Vit-Fe Fumarate-FA (PRENATAL MULTIVITAMIN) TABS tablet Take 1 tablet by mouth daily at 12 noon.   07/20/2021   acetaminophen (TYLENOL) 500 MG tablet Take 500 mg by mouth every 6 (six) hours as needed for mild pain or moderate pain.      cyclobenzaprine (FLEXERIL) 10 MG tablet Take 1 tablet (10 mg total) by mouth 2 (two) times daily as needed for muscle spasms. 20 tablet 0    ferrous sulfate 325 (65 FE) MG tablet Take 325 mg by mouth daily with breakfast.      ibuprofen (ADVIL) 600 MG tablet Take 1 tablet (600 mg total) by mouth every 6 (six) hours as needed for moderate pain, mild pain or cramping. 30 tablet 0     Review of Systems  Eyes:  Negative for visual disturbance.  Cardiovascular:  Negative for chest pain.  Gastrointestinal:  Negative for abdominal pain.  Neurological:  Positive for headaches.  Physical Exam    Blood pressure (!) 166/92, pulse 75, temperature 98.2 F (36.8 C), temperature source Oral, resp. rate 17, height 5' 5.5" (1.664 m), weight 95.2 kg, SpO2 99 %, unknown if currently breastfeeding. Patient Vitals for the past 24 hrs:  BP Temp Temp src Pulse Resp SpO2 Height Weight  07/20/21 1840 -- -- -- -- -- 99 % -- --  07/20/21 1835 (!) 166/92 -- -- 75 -- 97 % -- --  07/20/21 1831 (!) 171/98 -- -- 70 -- -- -- --  07/20/21 1830 -- -- -- -- -- 98 % -- --  07/20/21 1828 -- -- -- -- -- 98 % -- --  07/20/21 1827 (!) 171/102 -- -- 69 17 -- -- --  07/20/21 1825 -- -- -- -- -- 98 % -- --  07/20/21 1820 -- -- -- -- -- 98 % -- --  07/20/21 1815 -- -- -- -- -- 99 % -- --  07/20/21 1814 (!) 177/96 -- -- (!) 57 -- -- -- --  07/20/21 1810 -- -- -- -- -- 98 % -- --  07/20/21 1805 -- -- -- -- -- 99 % -- --  07/20/21 1801 (!) 186/97 -- -- (!) 55 -- -- -- --  07/20/21 1800 (!) 183/103 -- -- (!) 57 -- 100 % -- --  07/20/21 1755 -- -- -- -- -- 99 % -- --  07/20/21 1750 -- -- -- -- -- 100 % -- --  07/20/21 1740 -- -- -- -- -- 100 % -- --  07/20/21 1735 -- -- -- -- -- 100 % -- --  07/20/21 1731 (!) 201/107 -- -- (!) 57 -- -- -- --  07/20/21 1730 -- -- -- -- -- 99 % -- --  07/20/21 1701 (!) 182/102 98.2 F (36.8 C) Oral 61 -- 100 % -- --  07/20/21 1657 -- -- -- -- -- -- 5' 5.5" (1.664 m) 95.2 kg    Physical Exam Vitals and nursing note reviewed.  Constitutional:      General: She is not in acute distress.    Appearance: Normal appearance.  HENT:     Head: Normocephalic and atraumatic.  Pulmonary:     Effort: Pulmonary effort is normal. No respiratory distress.  Musculoskeletal:        General: Normal range of motion.     Cervical back: Normal range of motion.     Right lower leg: Edema (1+) present.     Left lower leg: Edema (1+) present.  Skin:    General: Skin is warm.  Neurological:     General: No focal deficit present.     Mental Status: She is alert and oriented to person, place,  and time.  Psychiatric:        Mood and Affect: Mood normal.        Behavior: Behavior normal.   Results for orders placed or performed during the hospital encounter of 07/20/21 (from the past 24 hour(s))  CBC     Status: Abnormal   Collection Time: 07/20/21  4:41 PM  Result Value Ref Range   WBC 8.4 4.0 - 10.5 K/uL   RBC 3.99 3.87 - 5.11 MIL/uL   Hemoglobin 11.1 (L) 12.0 - 15.0 g/dL   HCT 58.5 (L) 27.7 - 82.4 %   MCV 84.5 80.0 - 100.0 fL   MCH 27.8 26.0 - 34.0 pg   MCHC 32.9 30.0 - 36.0 g/dL   RDW 23.5 36.1 - 44.3 %   Platelets 289 150 - 400 K/uL   nRBC 0.0 0.0 - 0.2 %  Comprehensive metabolic panel     Status: Abnormal   Collection Time: 07/20/21  4:41 PM  Result Value Ref Range   Sodium 140 135 - 145 mmol/L   Potassium 4.0 3.5 - 5.1 mmol/L   Chloride 108 98 - 111 mmol/L   CO2 25 22 - 32 mmol/L   Glucose, Bld 79 70 - 99 mg/dL   BUN 6 6 - 20 mg/dL   Creatinine, Ser 1.54 0.44 - 1.00 mg/dL   Calcium 9.1 8.9 - 00.8 mg/dL   Total Protein 6.3 (L) 6.5 - 8.1 g/dL   Albumin 2.9 (L) 3.5 - 5.0 g/dL   AST 44 (H) 15 - 41 U/L   ALT 98 (H) 0 - 44 U/L   Alkaline Phosphatase 111 38 - 126 U/L   Total Bilirubin 0.4 0.3 - 1.2 mg/dL   GFR, Estimated >67 >61 mL/min   Anion gap 7 5 - 15  Urinalysis, Routine w reflex microscopic     Status: Abnormal   Collection Time: 07/20/21  5:43 PM  Result Value Ref Range   Color, Urine YELLOW YELLOW   APPearance HAZY (A) CLEAR   Specific Gravity, Urine 1.010 1.005 - 1.030   pH 8.0 5.0 - 8.0   Glucose, UA NEGATIVE NEGATIVE mg/dL   Hgb urine dipstick LARGE (A) NEGATIVE   Bilirubin Urine NEGATIVE NEGATIVE   Ketones, ur 5 (A) NEGATIVE mg/dL   Protein, ur 100 (A) NEGATIVE mg/dL   Nitrite NEGATIVE NEGATIVE   Leukocytes,Ua LARGE (A) NEGATIVE   RBC / HPF 21-50 0 - 5 RBC/hpf   WBC, UA 21-50 0 - 5 WBC/hpf   Bacteria, UA RARE (A) NONE SEEN   Squamous Epithelial / LPF 0-5 0 - 5   Mucus PRESENT     MAU Course  Procedures Labetalol IV Hydralazine  IV  MDM Labs ordered and reviewed. 1745: Dr. Mardelle Matte notified of presentation, clinical findings, and recommendation for admit, he will place orders and see pt.  Assessment and Plan   1. Hypertension in pregnancy, preeclampsia, severe, postpartum condition    Admit to [facility] unit [facility] per Dr. Patriciaann Clan, CNM 07/20/2021, 6:52 PM

## 2021-07-21 LAB — COMPREHENSIVE METABOLIC PANEL
ALT: 84 U/L — ABNORMAL HIGH (ref 0–44)
AST: 31 U/L (ref 15–41)
Albumin: 3 g/dL — ABNORMAL LOW (ref 3.5–5.0)
Alkaline Phosphatase: 115 U/L (ref 38–126)
Anion gap: 9 (ref 5–15)
BUN: 5 mg/dL — ABNORMAL LOW (ref 6–20)
CO2: 25 mmol/L (ref 22–32)
Calcium: 7.9 mg/dL — ABNORMAL LOW (ref 8.9–10.3)
Chloride: 104 mmol/L (ref 98–111)
Creatinine, Ser: 0.71 mg/dL (ref 0.44–1.00)
GFR, Estimated: >60 mL/min (ref >60)
Glucose, Bld: 100 mg/dL — ABNORMAL HIGH (ref 70–99)
Potassium: 3.2 mmol/L — ABNORMAL LOW (ref 3.5–5.1)
Sodium: 138 mmol/L (ref 135–145)
Total Bilirubin: 0.5 mg/dL (ref 0.3–1.2)
Total Protein: 6.4 g/dL — ABNORMAL LOW (ref 6.5–8.1)

## 2021-07-21 LAB — CBC
HCT: 36.5 % (ref 36.0–46.0)
Hemoglobin: 12.2 g/dL (ref 12.0–15.0)
MCH: 28.1 pg (ref 26.0–34.0)
MCHC: 33.4 g/dL (ref 30.0–36.0)
MCV: 84.1 fL (ref 80.0–100.0)
Platelets: 310 10*3/uL (ref 150–400)
RBC: 4.34 MIL/uL (ref 3.87–5.11)
RDW: 14.8 % (ref 11.5–15.5)
WBC: 10.4 10*3/uL (ref 4.0–10.5)
nRBC: 0 % (ref 0.0–0.2)

## 2021-07-21 MED ORDER — ACETAMINOPHEN 500 MG PO TABS
1000.0000 mg | ORAL_TABLET | Freq: Once | ORAL | Status: AC
  Administered 2021-07-21: 09:00:00 1000 mg via ORAL

## 2021-07-21 MED ORDER — ACETAMINOPHEN 500 MG PO TABS
1000.0000 mg | ORAL_TABLET | Freq: Four times a day (QID) | ORAL | Status: DC | PRN
  Filled 2021-07-21: qty 2

## 2021-07-21 NOTE — Progress Notes (Signed)
Has a mild right frontal HA this morning. This is not the same HA she had yesterday. No vision changes, no abdominal pain Just took Tylenol  Today's Vitals   07/21/21 0414 07/21/21 0419 07/21/21 0424 07/21/21 0851  BP:    139/87  Pulse:    95  Resp:    18  Temp:    98.9 F (37.2 C)  TempSrc:    Oral  SpO2: 99% 99% 99% 99%  Weight:      Height:      PainSc:       Body mass index is 34.38 kg/m.   UO good, 2450 since 3 am  Abdomen NT, FF DTR 2+  Results for orders placed or performed during the hospital encounter of 07/20/21 (from the past 24 hour(s))  CBC     Status: Abnormal   Collection Time: 07/20/21  4:41 PM  Result Value Ref Range   WBC 8.4 4.0 - 10.5 K/uL   RBC 3.99 3.87 - 5.11 MIL/uL   Hemoglobin 11.1 (L) 12.0 - 15.0 g/dL   HCT 12.1 (L) 97.5 - 88.3 %   MCV 84.5 80.0 - 100.0 fL   MCH 27.8 26.0 - 34.0 pg   MCHC 32.9 30.0 - 36.0 g/dL   RDW 25.4 98.2 - 64.1 %   Platelets 289 150 - 400 K/uL   nRBC 0.0 0.0 - 0.2 %  Comprehensive metabolic panel     Status: Abnormal   Collection Time: 07/20/21  4:41 PM  Result Value Ref Range   Sodium 140 135 - 145 mmol/L   Potassium 4.0 3.5 - 5.1 mmol/L   Chloride 108 98 - 111 mmol/L   CO2 25 22 - 32 mmol/L   Glucose, Bld 79 70 - 99 mg/dL   BUN 6 6 - 20 mg/dL   Creatinine, Ser 5.83 0.44 - 1.00 mg/dL   Calcium 9.1 8.9 - 09.4 mg/dL   Total Protein 6.3 (L) 6.5 - 8.1 g/dL   Albumin 2.9 (L) 3.5 - 5.0 g/dL   AST 44 (H) 15 - 41 U/L   ALT 98 (H) 0 - 44 U/L   Alkaline Phosphatase 111 38 - 126 U/L   Total Bilirubin 0.4 0.3 - 1.2 mg/dL   GFR, Estimated >07 >68 mL/min   Anion gap 7 5 - 15  Protein / creatinine ratio, urine     Status: Abnormal   Collection Time: 07/20/21  5:43 PM  Result Value Ref Range   Creatinine, Urine 45.76 mg/dL   Total Protein, Urine 69 mg/dL   Protein Creatinine Ratio 1.51 (H) 0.00 - 0.15 mg/mg[Cre]  Urinalysis, Routine w reflex microscopic     Status: Abnormal   Collection Time: 07/20/21  5:43 PM  Result  Value Ref Range   Color, Urine YELLOW YELLOW   APPearance HAZY (A) CLEAR   Specific Gravity, Urine 1.010 1.005 - 1.030   pH 8.0 5.0 - 8.0   Glucose, UA NEGATIVE NEGATIVE mg/dL   Hgb urine dipstick LARGE (A) NEGATIVE   Bilirubin Urine NEGATIVE NEGATIVE   Ketones, ur 5 (A) NEGATIVE mg/dL   Protein, ur 088 (A) NEGATIVE mg/dL   Nitrite NEGATIVE NEGATIVE   Leukocytes,Ua LARGE (A) NEGATIVE   RBC / HPF 21-50 0 - 5 RBC/hpf   WBC, UA 21-50 0 - 5 WBC/hpf   Bacteria, UA RARE (A) NONE SEEN   Squamous Epithelial / LPF 0-5 0 - 5   Mucus PRESENT   Resp Panel by RT-PCR (Flu A&B, Covid)  Status: None   Collection Time: 07/20/21  5:53 PM   Specimen: Nasopharyngeal(NP) swabs in vial transport medium  Result Value Ref Range   SARS Coronavirus 2 by RT PCR NEGATIVE NEGATIVE   Influenza A by PCR NEGATIVE NEGATIVE   Influenza B by PCR NEGATIVE NEGATIVE  Comprehensive metabolic panel     Status: Abnormal   Collection Time: 07/21/21  5:25 AM  Result Value Ref Range   Sodium 138 135 - 145 mmol/L   Potassium 3.2 (L) 3.5 - 5.1 mmol/L   Chloride 104 98 - 111 mmol/L   CO2 25 22 - 32 mmol/L   Glucose, Bld 100 (H) 70 - 99 mg/dL   BUN 5 (L) 6 - 20 mg/dL   Creatinine, Ser 1.61 0.44 - 1.00 mg/dL   Calcium 7.9 (L) 8.9 - 10.3 mg/dL   Total Protein 6.4 (L) 6.5 - 8.1 g/dL   Albumin 3.0 (L) 3.5 - 5.0 g/dL   AST 31 15 - 41 U/L   ALT 84 (H) 0 - 44 U/L   Alkaline Phosphatase 115 38 - 126 U/L   Total Bilirubin 0.5 0.3 - 1.2 mg/dL   GFR, Estimated >09 >60 mL/min   Anion gap 9 5 - 15  CBC     Status: None   Collection Time: 07/21/21  5:25 AM  Result Value Ref Range   WBC 10.4 4.0 - 10.5 K/uL   RBC 4.34 3.87 - 5.11 MIL/uL   Hemoglobin 12.2 12.0 - 15.0 g/dL   HCT 45.4 09.8 - 11.9 %   MCV 84.1 80.0 - 100.0 fL   MCH 28.1 26.0 - 34.0 pg   MCHC 33.4 30.0 - 36.0 g/dL   RDW 14.7 82.9 - 56.2 %   Platelets 310 150 - 400 K/uL   nRBC 0.0 0.0 - 0.2 %     A/P: PP preeclampsia           BP better on Procardia XL  30mg  qd           Labs improving           Magnesium sulfate infusing          HA probably secondary to magnesium           Tylenol prn          Continue Procardia XL 30mg  qd         Continue magnesium until 1800         Labs in am

## 2021-07-22 LAB — COMPREHENSIVE METABOLIC PANEL
ALT: 56 U/L — ABNORMAL HIGH (ref 0–44)
AST: 22 U/L (ref 15–41)
Albumin: 2.7 g/dL — ABNORMAL LOW (ref 3.5–5.0)
Alkaline Phosphatase: 102 U/L (ref 38–126)
Anion gap: 6 (ref 5–15)
BUN: <5 mg/dL — ABNORMAL LOW (ref 6–20)
CO2: 24 mmol/L (ref 22–32)
Calcium: 8.2 mg/dL — ABNORMAL LOW (ref 8.9–10.3)
Chloride: 109 mmol/L (ref 98–111)
Creatinine, Ser: 0.73 mg/dL (ref 0.44–1.00)
GFR, Estimated: >60 mL/min (ref >60)
Glucose, Bld: 90 mg/dL (ref 70–99)
Potassium: 3.2 mmol/L — ABNORMAL LOW (ref 3.5–5.1)
Sodium: 139 mmol/L (ref 135–145)
Total Bilirubin: 0.6 mg/dL (ref 0.3–1.2)
Total Protein: 6 g/dL — ABNORMAL LOW (ref 6.5–8.1)

## 2021-07-22 LAB — CBC
HCT: 34.9 % — ABNORMAL LOW (ref 36.0–46.0)
Hemoglobin: 11 g/dL — ABNORMAL LOW (ref 12.0–15.0)
MCH: 26.8 pg (ref 26.0–34.0)
MCHC: 31.5 g/dL (ref 30.0–36.0)
MCV: 85.1 fL (ref 80.0–100.0)
Platelets: 312 10*3/uL (ref 150–400)
RBC: 4.1 MIL/uL (ref 3.87–5.11)
RDW: 15 % (ref 11.5–15.5)
WBC: 8.2 10*3/uL (ref 4.0–10.5)
nRBC: 0 % (ref 0.0–0.2)

## 2021-07-22 MED ORDER — NIFEDIPINE ER 30 MG PO TB24: 30.0000 mg | ORAL_TABLET | Freq: Every day | ORAL | 1 refills | Status: AC

## 2021-07-22 NOTE — Discharge Instructions (Signed)
Call MD for T>100.4, heavy vaginal bleeding, severe abdominal pain, respiratory distress or headache that does not resolve with Tylenol.  Call office to schedule blood pressure check on Tuesday.

## 2021-07-22 NOTE — Progress Notes (Signed)
No c/o.  No HA, vision change, RUQ pain, CP/SOB.  Feeling much better.    Vitals:   07/22/21 0319 07/22/21 0853  BP: (!) 129/59 124/79  Pulse: 81 83  Resp: 16 16  Temp: 98.1 F (36.7 C) 98.8 F (37.1 C)  SpO2: 100% 100%   CBC Latest Ref Rng & Units 07/22/2021 07/21/2021 07/20/2021  WBC 4.0 - 10.5 K/uL 8.2 10.4 8.4  Hemoglobin 12.0 - 15.0 g/dL 11.0(L) 12.2 11.1(L)  Hematocrit 36.0 - 46.0 % 34.9(L) 36.5 33.7(L)  Platelets 150 - 400 K/uL 312 310 289   CMP Latest Ref Rng & Units 07/22/2021 07/21/2021 07/20/2021  Glucose 70 - 99 mg/dL 90 141(C) 79  BUN 6 - 20 mg/dL <3(U) 5(L) 6  Creatinine 0.44 - 1.00 mg/dL 1.31 4.38 8.87  Sodium 135 - 145 mmol/L 139 138 140  Potassium 3.5 - 5.1 mmol/L 3.2(L) 3.2(L) 4.0  Chloride 98 - 111 mmol/L 109 104 108  CO2 22 - 32 mmol/L 24 25 25   Calcium 8.9 - 10.3 mg/dL 8.2(L) 7.9(L) 9.1  Total Protein 6.5 - 8.1 g/dL 6.0(L) 6.4(L) 6.3(L)  Total Bilirubin 0.3 - 1.2 mg/dL 0.6 0.5 0.4  Alkaline Phos 38 - 126 U/L 102 115 111  AST 15 - 41 U/L 22 31 44(H)  ALT 0 - 44 U/L 56(H) 84(H) 98(H)   Gen: A&O X 3 Abd: soft, NT Ext: SCDs on.  1+ pedal edema bilaterally  25yo G1P1 PPD8/HD3 readmitted with postpartum pre-eclampsia with severe features -s/p magnesium sulfate -Procardia 30 XL QD -Labs improving AST 31->22, ALT 84->56 -Resolution of symptoms; no HA -Will d/c home with office f/u on Tuesday -Strict precautions given and will monitor BP at home  Monday, DO

## 2021-07-22 NOTE — Discharge Summary (Signed)
Physician Discharge Summary  Patient ID: Amanda Wilkinson MRN: [id number] DOB/AGE: 09-07-1995 25 y.o.  Admit date: 07/20/2021 Discharge date: 07/22/2021  Admission Diagnoses: Postpartum pre-eclampsia with severe features  Discharge Diagnoses:  Principal Problem:   Hypertension in pregnancy, preeclampsia, severe, delivered/postpartum   Discharged Condition: good  Hospital Course: Patient was readmitted to hospital on PPD 6 s/p uncomplicated delivery and postpartum period.  She reported headache and elevated BPs at home.  In the MAU, the patient had severe range blood pressures which required IV labetalol.  LFTs were elevated (AST 44, ALT 98) and P:C 1.51.  Patient was admitted and magnesium sulfate was administered.  Procardia 30 XL daily was started and by HD3, BPs were improved, patient was asymptomatic and labs were normalizing.  She was discharged home with Procardia and f/u in office on Tuesday.  Consults: None  Significant Diagnostic Studies: none  Treatments: magnesium sulfate, IV anti-hypertensives  Discharge Exam: Blood pressure 124/79, pulse 83, temperature 98.8 F (37.1 C), temperature source Oral, resp. rate 16, height 5' 5.5" (1.664 m), weight 95.2 kg, SpO2 100 %, unknown if currently breastfeeding. General appearance: alert, cooperative, and appears stated age Extremities: edema 1+ b/l pedal Abd: soft, NT  Disposition: Discharge disposition: 01-Home or Self Care        Allergies as of 07/22/2021   No Known Allergies      Medication List     TAKE these medications    acetaminophen 500 MG tablet Commonly known as: TYLENOL Take 1,000 mg by mouth every 6 (six) hours as needed for mild pain or moderate pain.   docusate sodium 100 MG capsule Commonly known as: COLACE Take 100 mg by mouth daily as needed for mild constipation.   ferrous sulfate 325 (65 FE) MG tablet Take 325 mg by mouth daily with breakfast.   ibuprofen 600 MG tablet Commonly known  as: ADVIL Take 1 tablet (600 mg total) by mouth every 6 (six) hours as needed for moderate pain, mild pain or cramping. What changed: Another medication with the same name was removed. Continue taking this medication, and follow the directions you see here.   NIFEdipine 30 MG 24 hr tablet Commonly known as: ADALAT CC Take 1 tablet (30 mg total) by mouth at bedtime.   prenatal multivitamin Tabs tablet Take 1 tablet by mouth daily at 12 noon.         Signed: Mitchel Honour 07/22/2021, 9:42 AM

## 2021-07-27 ENCOUNTER — Telehealth (HOSPITAL_COMMUNITY): Payer: Self-pay | Admitting: *Deleted

## 2021-07-27 NOTE — Telephone Encounter (Signed)
Attempted hospital discharge follow-up call. Left message for patient to return RN call. Erline Levine, RN, [phone], 07/27/21

## 2021-07-27 NOTE — Telephone Encounter (Signed)
Return call received from patient. Patient voiced no questions or concerns at this time. EPDS=1. Patient voiced no questions or concerns regarding infant at this time. Patient reports infant sleeps in a bassinet on his back. RN reviewed ABCs of safe sleep. Patient verbalized understanding. Patient requested RN email information on hospital's virtual postpartum classes and support groups. Email sent. Deforest Hoyles, RN,  07/27/21, [phone]

## 2022-03-22 ENCOUNTER — Encounter: Payer: Self-pay | Admitting: Emergency Medicine

## 2022-03-22 ENCOUNTER — Ambulatory Visit
Admission: EM | Admit: 2022-03-22 | Discharge: 2022-03-22 | Disposition: A | Discharge status: Home / Self Care | Payer: No Typology Code available for payment source | Attending: Urgent Care | Admitting: Urgent Care

## 2022-03-22 ENCOUNTER — Ambulatory Visit (INDEPENDENT_AMBULATORY_CARE_PROVIDER_SITE_OTHER): Payer: No Typology Code available for payment source

## 2022-03-22 DIAGNOSIS — R0789 Other chest pain: Secondary | ICD-10-CM | POA: Diagnosis not present

## 2022-03-22 MED ORDER — NAPROXEN 375 MG PO TABS: 375.0000 mg | ORAL_TABLET | Freq: Two times a day (BID) | ORAL | 0 refills | Status: AC

## 2022-03-22 MED ORDER — TIZANIDINE HCL 4 MG PO TABS
4.0000 mg | ORAL_TABLET | Freq: Every day | ORAL | 0 refills | Status: AC
Start: 2022-03-22 — End: ?

## 2022-03-22 NOTE — ED Triage Notes (Signed)
Pt here with centralized chest pain that extends slightly to the right of the sternum x 1 month. The pain is only present when she moves, breaths deeply or touches the area.

## 2022-03-22 NOTE — ED Provider Notes (Signed)
[facility]  Note:  This document was prepared using Conservation officer, historic buildings and may include unintentional dictation errors.  MRN: [id number] DOB: May 16, 1996  Subjective:   Amanda Wilkinson is a 26 y.o. female presenting for 1 month history of persistent mid to right-sided chest pain.  Taking a deep breath, moving and using the chest area elicit the pain.  She has an 86-month-old and weighs 20 pounds.  She has to do a lot of lifting with her baby.  She also recently started working out and feels like she does not know what she is doing in the gym but was doing workouts with dumbbells and thinks this may have contributed.  No fall, trauma, shortness of breath or wheezing.  No history of respiratory disorders.  Patient is not breast-feeding.  No current facility-administered medications for this encounter.  Current Outpatient Medications:    acetaminophen (TYLENOL) 500 MG tablet, Take 1,000 mg by mouth every 6 (six) hours as needed for mild pain or moderate pain., Disp: , Rfl:    docusate sodium (COLACE) 100 MG capsule, Take 100 mg by mouth daily as needed for mild constipation., Disp: , Rfl:    ferrous sulfate 325 (65 FE) MG tablet, Take 325 mg by mouth daily with breakfast., Disp: , Rfl:    ibuprofen (ADVIL) 600 MG tablet, Take 1 tablet (600 mg total) by mouth every 6 (six) hours as needed for moderate pain, mild pain or cramping. (Patient not taking: Reported on 07/21/2021), Disp: 30 tablet, Rfl: 0   NIFEdipine (ADALAT CC) 30 MG 24 hr tablet, Take 1 tablet (30 mg total) by mouth at bedtime., Disp: 30 tablet, Rfl: 1   Prenatal Vit-Fe Fumarate-FA (PRENATAL MULTIVITAMIN) TABS tablet, Take 1 tablet by mouth daily at 12 noon., Disp: , Rfl:    No Known Allergies  Past Medical History:  Diagnosis Date   Medical history non-contributory      History reviewed. No pertinent surgical history.  Family History  Problem Relation Age of Onset   Heart disease Father      Social History   Tobacco Use   Smoking status: Never  Substance Use Topics   Alcohol use: No   Drug use: Never    Review of Systems  Psychiatric/Behavioral:  Nervous/anxious: is.      Objective:   Vitals: BP (!) 158/85   Pulse 78   Temp 98.6 F (37 C)   Resp 18   SpO2 98%   Physical Exam Constitutional:      General: She is not in acute distress.    Appearance: Normal appearance. She is well-developed. She is not ill-appearing, toxic-appearing or diaphoretic.  HENT:     Head: Normocephalic and atraumatic.     Nose: Nose normal.     Mouth/Throat:     Mouth: Mucous membranes are moist.  Eyes:     General: No scleral icterus.       Right eye: No discharge.        Left eye: No discharge.     Extraocular Movements: Extraocular movements intact.  Cardiovascular:     Rate and Rhythm: Normal rate and regular rhythm.     Heart sounds: Normal heart sounds. No murmur heard.    No friction rub. No gallop.  Pulmonary:     Effort: Pulmonary effort is normal. No respiratory distress.     Breath sounds: No stridor. No wheezing, rhonchi or rales.  Chest:     Chest wall: Tenderness (  over area outlined) present.    Skin:    General: Skin is warm and dry.  Neurological:     General: No focal deficit present.     Mental Status: She is alert and oriented to person, place, and time.  Psychiatric:        Mood and Affect: Mood normal.        Behavior: Behavior normal.     DG Chest 2 View  Result Date: 03/22/2022 CLINICAL DATA:  Patient states for 1 month she has had central chest when waking in the morning and with upper body rotation. Patient states the pain is moving more to her upper right chest area now. chest tightness EXAM: CHEST - 2 VIEW COMPARISON:  None Available. FINDINGS: Normal mediastinum and cardiac silhouette. Normal pulmonary vasculature. No evidence of effusion, infiltrate, or pneumothorax. No acute bony abnormality. IMPRESSION: Normal chest radiograph.  Electronically Signed   By: Genevive Bi M.D.   On: 03/22/2022 19:22     Assessment and Plan :   PDMP not reviewed this encounter.  1. Right-sided chest wall pain    Suspect inflammatory musculoskeletal type pain, chest wall strain, costochondritis.  Recommended conservative management with modification of her exercise and using naproxen, tizanidine.  Continue to hydrate very well. Counseled patient on potential for adverse effects with medications prescribed/recommended today, ER and return-to-clinic precautions discussed, patient verbalized understanding.    Wallis Bamberg, [place] 03/22/22 1943

## 2024-01-15 ENCOUNTER — Other Ambulatory Visit: Payer: Self-pay

## 2024-01-15 ENCOUNTER — Encounter (HOSPITAL_BASED_OUTPATIENT_CLINIC_OR_DEPARTMENT_OTHER): Payer: Self-pay

## 2024-01-15 ENCOUNTER — Emergency Department (HOSPITAL_BASED_OUTPATIENT_CLINIC_OR_DEPARTMENT_OTHER)
Admission: EM | Admit: 2024-01-15 | Discharge: 2024-01-15 | Disposition: A | Discharge status: Home / Self Care | Attending: Emergency Medicine | Admitting: Emergency Medicine

## 2024-01-15 DIAGNOSIS — H1031 Unspecified acute conjunctivitis, right eye: Secondary | ICD-10-CM | POA: Insufficient documentation

## 2024-01-15 DIAGNOSIS — H5789 Other specified disorders of eye and adnexa: Secondary | ICD-10-CM | POA: Diagnosis present

## 2024-01-15 MED ORDER — FLUORESCEIN SODIUM 1 MG OP STRP
1.0000 | ORAL_STRIP | Freq: Once | OPHTHALMIC | Status: DC
  Filled 2024-01-15: qty 1

## 2024-01-15 MED ORDER — ERYTHROMYCIN 5 MG/GM OP OINT: TOPICAL_OINTMENT | OPHTHALMIC | 0 refills | Status: AC

## 2024-01-15 MED ORDER — TETRACAINE HCL 0.5 % OP SOLN
2.0000 [drp] | Freq: Once | OPHTHALMIC | Status: DC
  Filled 2024-01-15: qty 4

## 2024-01-15 NOTE — ED Notes (Signed)

## 2024-01-15 NOTE — Discharge Instructions (Addendum)
 As discussed, will put you on antibiotics eyedrops for the eye.  Please use as directed.  Recommend follow-up with the eye specialist for reassessment in the outpatient setting.  Please do not hesitate to return to emergency department if the worrisome signs or symptoms we discussed become apparent.

## 2024-01-15 NOTE — ED Provider Notes (Signed)
 [facility] [place] Provider Note   CSN: [id number] Arrival date & time: 01/15/24  1759     Patient presents with: No chief complaint on file.   Amanda Wilkinson is a 28 y.o. female.   HPI   28 year old female presents emergency department with complaints of right eye redness, purulent drainage.  States that this has been present since yesterday.  States that her son as well as her son's father both have had pinkeye and had been on antibiotics for this.  States that she has been dealing with cough, sore throat, runny nose, sore throat since Tuesday.  States that she is unsure of whether or not her pinkeye is related bacterial cause or from her current illness.  Denies any visual changes.  Denies any eyeball pain.  Presents emergency department for further assessment.  No significant pertinent past medical history.  Prior to Admission medications   Medication Sig Start Date End Date Taking? Authorizing Provider  acetaminophen  (TYLENOL ) 500 MG tablet Take 1,000 mg by mouth every 6 (six) hours as needed for mild pain or moderate pain.    [provider]  docusate sodium  (COLACE) 100 MG capsule Take 100 mg by mouth daily as needed for mild constipation.    [provider]  ferrous sulfate 325 (65 FE) MG tablet Take 325 mg by mouth daily with breakfast.    [provider]  ibuprofen  (ADVIL ) 600 MG tablet Take 1 tablet (600 mg total) by mouth every 6 (six) hours as needed for moderate pain, mild pain or cramping. Patient not taking: Reported on 07/21/2021 07/16/21   Marget Lenis, MD  naproxen  (NAPROSYN ) 375 MG tablet Take 1 tablet (375 mg total) by mouth 2 (two) times daily with a meal. 03/22/22   Christopher Savannah, PA-C  NIFEdipine  (ADALAT  CC) 30 MG 24 hr tablet Take 1 tablet (30 mg total) by mouth at bedtime. 07/22/21   Morris, Megan, DO  Prenatal Vit-Fe Fumarate-FA (PRENATAL MULTIVITAMIN) TABS tablet Take 1 tablet by mouth daily at  12 noon.    [provider]  tiZANidine  (ZANAFLEX ) 4 MG tablet Take 1 tablet (4 mg total) by mouth at bedtime. 03/22/22   Christopher Savannah, PA-C    Allergies: Patient has no known allergies.    Review of Systems  Updated Vital Signs There were no vitals taken for this visit.  Physical Exam Vitals and nursing note reviewed.  Constitutional:      General: She is not in acute distress.    Appearance: She is well-developed.  HENT:     Head: Normocephalic and atraumatic.   Eyes:     General: Lids are normal. Lids are everted, no foreign bodies appreciated. Vision grossly intact. Gaze aligned appropriately.        Right eye: Discharge present. No foreign body or hordeolum.        Left eye: No foreign body, discharge or hordeolum.     Intraocular pressure: Right eye pressure is 19 mmHg. Measurements were taken using a handheld tonometer.    Extraocular Movements: Extraocular movements intact.     Conjunctiva/sclera:     Right eye: Right conjunctiva is injected. Exudate present.     Comments: Fluorescein exam showed no uptake.  No obvious foreign body.  Seidel sign negative.   Cardiovascular:     Rate and Rhythm: Normal rate and regular rhythm.     Heart sounds: No murmur heard. Pulmonary:     Effort: Pulmonary effort is normal.  No respiratory distress.     Breath sounds: Normal breath sounds.  Abdominal:     Palpations: Abdomen is soft.     Tenderness: There is no abdominal tenderness.   Musculoskeletal:        General: No swelling.     Cervical back: Neck supple.   Skin:    General: Skin is warm and dry.     Capillary Refill: Capillary refill takes less than 2 seconds.   Neurological:     Mental Status: She is alert.   Psychiatric:        Mood and Affect: Mood normal.     (all labs ordered are listed, but only abnormal results are displayed) Labs Reviewed - No data to display  EKG: None  Radiology: No results found.   Procedures   Medications Ordered in  the ED  fluorescein ophthalmic strip 1 strip (has no administration in time range)  tetracaine (PONTOCAINE) 0.5 % ophthalmic solution 2 drop (has no administration in time range)                                    Medical Decision Making Risk Prescription drug management.   This patient presents to the ED for concern of red eye, this involves an extensive number of treatment options, and is a complaint that carries with it a high risk of complications and morbidity.  The differential diagnosis includes glaucoma, conjunctivitis, corneal abrasion, corneal ulceration, periorbital/orbital cellulitis, globe perforation, hordeolum, chalazion, optic neuritis, retrobulbar hematoma, other   Co morbidities that complicate the patient evaluation  See HPI   Additional history obtained:  Additional history obtained from EMR External records from outside source obtained and reviewed including hospital records   Lab Tests:  N/a   Imaging Studies ordered:  N/a   Cardiac Monitoring: / EKG:  N/a   Consultations Obtained:  N/a   Problem List / ED Course / Critical interventions / Medication management  Conjunctivitis I ordered medication including fluorescein, tetracaine  Reevaluation of the patient after these medicines showed that the patient improved I have reviewed the patients home medicines and have made adjustments as needed   Social Determinants of Health:  Denies tobacco, licit drug use.   Test / Admission - Considered:  Conjunctivitis Vitals signs significant for hypertension. Otherwise within normal range and stable throughout visit. 28 year old female presents emergency department with complaints of right eye redness, purulent drainage.  States that this has been present since yesterday.  States that her son as well as her son's father both have had pinkeye and had been on antibiotics for this.  States that she has been dealing with cough, sore throat, runny  nose, sore throat since Tuesday.  States that she is unsure of whether or not her pinkeye is related bacterial cause or from her current illness.  Denies any visual changes.  Denies any eyeball pain.  Presents emergency department for further assessment. On exam, injected right conjunctiva with purulent discharge present.  EOMs intact without pain.  No obvious foreign body on fluorescein exam.  IOP is normal.  Patient at baseline.  Will place patient on topical antibiotics given concern for bacterial etiology of symptoms given known exposure x 2 to family members.  Recommend follow-up with eye specialist in the outpatient setting.  Treatment plan discussed with patient and she acknowledges that he was agreeable to said plan.  Patient overall well-appearing, afebrile in no  acute distress. Worrisome signs and symptoms were discussed with the patient, and the patient acknowledged understanding to return to the ED if noticed. Patient was stable upon discharge.       Final diagnoses:  None    ED Discharge Orders     None          Silver Wonda LABOR, [place] 01/15/24 SHARLENE    Armenta Canning, MD 01/18/24 1344

## 2024-01-15 NOTE — ED Triage Notes (Signed)
 Pt presents with cold like symptoms since Tuesday, cough, sore throat, headache. Started having right eye redness, drainage and pain. Son and husband had pink eye a few days ago.
